# Patient Record
Sex: Male | Born: 1965 | Race: Black or African American | Hispanic: No | Marital: Married | Smoking: Never smoker
Health system: Southern US, Community
[De-identification: ages and names within clinical notes are randomized; demographics above are authoritative.]

## PROBLEM LIST (undated history)

## (undated) HISTORY — PX: LITHOTRIPSY: SUR834

---

## 2004-06-25 ENCOUNTER — Emergency Department: Payer: Self-pay | Admitting: Emergency Medicine

## 2004-08-05 ENCOUNTER — Emergency Department: Payer: Self-pay | Admitting: Emergency Medicine

## 2005-05-08 ENCOUNTER — Emergency Department: Payer: Self-pay | Admitting: Emergency Medicine

## 2005-05-19 ENCOUNTER — Emergency Department: Payer: Self-pay | Admitting: Emergency Medicine

## 2005-05-22 ENCOUNTER — Emergency Department: Payer: Self-pay | Admitting: Internal Medicine

## 2005-08-31 ENCOUNTER — Emergency Department: Payer: Self-pay | Admitting: General Practice

## 2008-12-04 ENCOUNTER — Emergency Department: Payer: Self-pay | Admitting: Emergency Medicine

## 2009-02-15 ENCOUNTER — Emergency Department: Payer: Self-pay | Admitting: Emergency Medicine

## 2009-02-22 ENCOUNTER — Emergency Department: Payer: Self-pay | Admitting: Emergency Medicine

## 2009-03-09 ENCOUNTER — Emergency Department: Payer: Self-pay | Admitting: Emergency Medicine

## 2009-04-14 ENCOUNTER — Emergency Department: Payer: Self-pay | Admitting: Unknown Physician Specialty

## 2009-07-06 ENCOUNTER — Emergency Department: Payer: Self-pay | Admitting: Emergency Medicine

## 2009-10-19 ENCOUNTER — Emergency Department: Payer: Self-pay | Admitting: Emergency Medicine

## 2010-11-08 ENCOUNTER — Emergency Department: Payer: Self-pay | Admitting: Emergency Medicine

## 2010-11-29 ENCOUNTER — Emergency Department: Payer: Self-pay | Admitting: Emergency Medicine

## 2011-02-14 ENCOUNTER — Emergency Department: Payer: Self-pay | Admitting: Emergency Medicine

## 2011-02-21 ENCOUNTER — Emergency Department: Payer: Self-pay | Admitting: Internal Medicine

## 2011-06-07 ENCOUNTER — Emergency Department: Payer: Self-pay | Admitting: *Deleted

## 2011-07-05 ENCOUNTER — Emergency Department: Payer: Self-pay | Admitting: Emergency Medicine

## 2011-07-07 ENCOUNTER — Emergency Department: Payer: Self-pay | Admitting: Emergency Medicine

## 2011-07-09 ENCOUNTER — Emergency Department: Payer: Self-pay | Admitting: Unknown Physician Specialty

## 2011-07-11 ENCOUNTER — Emergency Department: Payer: Self-pay | Admitting: Emergency Medicine

## 2011-08-29 ENCOUNTER — Emergency Department: Payer: Self-pay | Admitting: Unknown Physician Specialty

## 2012-12-25 ENCOUNTER — Emergency Department: Payer: Self-pay | Admitting: Emergency Medicine

## 2012-12-25 LAB — BASIC METABOLIC PANEL
Anion Gap: 3 — ABNORMAL LOW (ref 7–16)
Co2: 29 mmol/L (ref 21–32)
Creatinine: 1.03 mg/dL (ref 0.60–1.30)
Glucose: 91 mg/dL (ref 65–99)
Osmolality: 278 (ref 275–301)
Potassium: 4.6 mmol/L (ref 3.5–5.1)

## 2012-12-25 LAB — CBC
HCT: 41.1 % (ref 40.0–52.0)
MCH: 31.4 pg (ref 26.0–34.0)
MCHC: 33.9 g/dL (ref 32.0–36.0)
Platelet: 152 10*3/uL (ref 150–440)
RDW: 14 % (ref 11.5–14.5)

## 2012-12-25 LAB — TROPONIN I: Troponin-I: <0.02 ng/mL

## 2012-12-27 ENCOUNTER — Observation Stay: Payer: Self-pay | Admitting: Internal Medicine

## 2012-12-27 LAB — CBC
HCT: 41.9 % (ref 40.0–52.0)
MCHC: 34.2 g/dL (ref 32.0–36.0)
MCV: 93 fL (ref 80–100)
RBC: 4.52 10*6/uL (ref 4.40–5.90)
RDW: 14.3 % (ref 11.5–14.5)
WBC: 5.7 10*3/uL (ref 3.8–10.6)

## 2012-12-27 LAB — CK TOTAL AND CKMB (NOT AT ARMC)
CK, Total: 357 U/L — ABNORMAL HIGH (ref 35–232)
CK-MB: 2.4 ng/mL (ref 0.5–3.6)

## 2012-12-27 LAB — BASIC METABOLIC PANEL
Chloride: 105 mmol/L (ref 98–107)
Co2: 25 mmol/L (ref 21–32)
EGFR (Non-African Amer.): >60
Glucose: 86 mg/dL (ref 65–99)
Osmolality: 274 (ref 275–301)
Potassium: 4.1 mmol/L (ref 3.5–5.1)
Sodium: 138 mmol/L (ref 136–145)

## 2012-12-28 LAB — CBC WITH DIFFERENTIAL/PLATELET
Basophil #: 0 10*3/uL (ref 0.0–0.1)
Basophil %: 0.4 %
HCT: 40.1 % (ref 40.0–52.0)
HGB: 13.3 g/dL (ref 13.0–18.0)
Lymphocyte #: 1.6 10*3/uL (ref 1.0–3.6)
MCHC: 33.1 g/dL (ref 32.0–36.0)
MCV: 93 fL (ref 80–100)
Monocyte #: 0.5 x10 3/mm (ref 0.2–1.0)
Monocyte %: 9.6 %
Neutrophil %: 57.8 %
Platelet: 142 10*3/uL — ABNORMAL LOW (ref 150–440)
RBC: 4.29 10*6/uL — ABNORMAL LOW (ref 4.40–5.90)
RDW: 14.5 % (ref 11.5–14.5)

## 2012-12-28 LAB — LIPID PANEL
Ldl Cholesterol, Calc: 141 mg/dL — ABNORMAL HIGH (ref 0–100)
Triglycerides: 98 mg/dL (ref 0–200)
VLDL Cholesterol, Calc: 20 mg/dL (ref 5–40)

## 2012-12-28 LAB — BASIC METABOLIC PANEL
Anion Gap: 5 — ABNORMAL LOW (ref 7–16)
Calcium, Total: 8.3 mg/dL — ABNORMAL LOW (ref 8.5–10.1)
Co2: 29 mmol/L (ref 21–32)
EGFR (African American): >60
EGFR (Non-African Amer.): >60
Glucose: 104 mg/dL — ABNORMAL HIGH (ref 65–99)
Potassium: 4 mmol/L (ref 3.5–5.1)
Sodium: 136 mmol/L (ref 136–145)

## 2013-07-01 ENCOUNTER — Emergency Department: Payer: Self-pay | Admitting: Emergency Medicine

## 2013-07-01 LAB — URINALYSIS, COMPLETE
Blood: NEGATIVE
Glucose,UR: NEGATIVE mg/dL (ref 0–75)
Ketone: NEGATIVE
Leukocyte Esterase: NEGATIVE
Nitrite: NEGATIVE
Ph: 5 (ref 4.5–8.0)
Protein: NEGATIVE
Squamous Epithelial: <1
WBC UR: 1 /HPF (ref 0–5)

## 2013-07-01 LAB — COMPREHENSIVE METABOLIC PANEL
Albumin: 3.8 g/dL (ref 3.4–5.0)
Alkaline Phosphatase: 65 U/L (ref 50–136)
Anion Gap: 9 (ref 7–16)
BUN: 10 mg/dL (ref 7–18)
Bilirubin,Total: 0.2 mg/dL (ref 0.2–1.0)
Calcium, Total: 8.5 mg/dL (ref 8.5–10.1)
Co2: 23 mmol/L (ref 21–32)
EGFR (African American): >60
EGFR (Non-African Amer.): >60
Glucose: 103 mg/dL — ABNORMAL HIGH (ref 65–99)
Sodium: 136 mmol/L (ref 136–145)
Total Protein: 7 g/dL (ref 6.4–8.2)

## 2013-07-01 LAB — ETHANOL
Ethanol %: 0.21 % — ABNORMAL HIGH (ref 0.000–0.080)
Ethanol: 210 mg/dL

## 2013-07-01 LAB — DRUG SCREEN, URINE
Amphetamines, Ur Screen: NEGATIVE (ref ?–1000)
Barbiturates, Ur Screen: NEGATIVE (ref ?–200)
Benzodiazepine, Ur Scrn: NEGATIVE (ref ?–200)
Phencyclidine (PCP) Ur S: NEGATIVE (ref ?–25)

## 2013-07-01 LAB — CBC
HGB: 13.1 g/dL (ref 13.0–18.0)
MCH: 31.6 pg (ref 26.0–34.0)
RBC: 4.13 10*6/uL — ABNORMAL LOW (ref 4.40–5.90)
RDW: 13.7 % (ref 11.5–14.5)
WBC: 5.3 10*3/uL (ref 3.8–10.6)

## 2013-07-19 ENCOUNTER — Emergency Department: Payer: Self-pay | Admitting: Emergency Medicine

## 2013-07-31 ENCOUNTER — Emergency Department: Payer: Self-pay | Admitting: Internal Medicine

## 2013-07-31 LAB — BASIC METABOLIC PANEL
Anion Gap: 1 — ABNORMAL LOW (ref 7–16)
BUN: 16 mg/dL (ref 7–18)
Chloride: 106 mmol/L (ref 98–107)
Creatinine: 1.11 mg/dL (ref 0.60–1.30)
EGFR (African American): >60
Glucose: 100 mg/dL — ABNORMAL HIGH (ref 65–99)
Osmolality: 277 (ref 275–301)
Potassium: 3.8 mmol/L (ref 3.5–5.1)
Sodium: 138 mmol/L (ref 136–145)

## 2013-07-31 LAB — CBC
HCT: 40 % (ref 40.0–52.0)
HGB: 13.5 g/dL (ref 13.0–18.0)
MCH: 31 pg (ref 26.0–34.0)
MCHC: 33.6 g/dL (ref 32.0–36.0)
MCV: 92 fL (ref 80–100)
Platelet: 157 10*3/uL (ref 150–440)
RBC: 4.34 10*6/uL — ABNORMAL LOW (ref 4.40–5.90)
RDW: 13.9 % (ref 11.5–14.5)
WBC: 4.7 10*3/uL (ref 3.8–10.6)

## 2013-07-31 LAB — TROPONIN I: Troponin-I: <0.02 ng/mL

## 2013-12-13 ENCOUNTER — Emergency Department: Payer: Self-pay | Admitting: Emergency Medicine

## 2014-05-20 ENCOUNTER — Emergency Department: Payer: Self-pay | Admitting: Student

## 2015-01-08 NOTE — Consult Note (Signed)
PATIENT NAME:  [name], [name] MR#:  [id number] DATE OF BIRTH:  [birth year]  DATE OF CONSULTATION:  12/27/2012  REFERRING PHYSICIAN:   CONSULTING PHYSICIAN:  Laurier NancyShaukat A. Tonee Silverstein, MD  INDICATION FOR CONSULTATION: Chest pain.  HISTORY OF PRESENT ILLNESS:  This is a 49 year old African American male with a past medical history of no significant medical problems who came into the hospital a few days ago with chest pain which started Tuesday.  Has been having chest pain since then. He was seen in the Emergency Room, was supposed to be followed up by Dr. Mariah MillingGollan, but apparently nobody called him.  He comes back again with chest pain. He ruled out for MI at that time. He is having chest pain again. He continues to have chest pain. The chest pain is described as sharp, associated with shortness of breath.  His first set of cardiac enzymes and the second set on April 9th were negative. The rest of the labs were negative. Today, on the first set of cardiac enzymes, the troponin is negative, but CPK is slightly elevated 375.  PAST MEDICAL HISTORY: No history of diabetes, hypertension or hyperlipidemia.    FAMILY HISTORY: Unremarkable.   SOCIAL HISTORY: Denies EtOH abuse or smoking.   PHYSICAL EXAMINATION: GENERAL: He is alert, oriented x 3, in no acute distress.  VITAL SIGNS: Stable.  NECK: No JVD.  LUNGS: Clear.  HEART: Regular rate and rhythm. Normal S1, S2. No audible murmur.  ABDOMEN: Soft, nontender. Positive bowel sounds.  EXTREMITIES: No pedal edema.  NEUROLOGIC: The patient appears to be intact.   LABORATORY AND DIAGNOSTICS:  EKG shows normal sinus rhythm, no acute changes.   His first set of cardiac enzymes is negative. CPK is 357.   ASSESSMENT AND PLAN: Atypical chest pain most likely secondary to musculoskeletal pain. CPK is elevated. He denies any trauma to the chest, but appears to be atypical chest pain. Advise giving nonsteroidal anti-inflammatory drugs to relieve the pain. Advise getting  3 sets of cardiac enzymes.  Thank you very much for the referral.   ____________________________ Laurier NancyShaukat A. Charnice Zwilling, MD sak:sb D: 12/27/2012 09:16:18 ET T: 12/27/2012 09:37:28 ET JOB#: [id number]  cc: Laurier NancyShaukat A. Yailyn Strack, MD, <Dictator> Laurier NancySHAUKAT A Jeromey Kruer MD ELECTRONICALLY SIGNED 12/30/2012 8:54

## 2015-01-08 NOTE — H&P (Signed)
PATIENT NAME:  [name], [name] MR#:  [id number] DATE OF BIRTH:  [birth year]  DATE OF ADMISSION:  12/27/2012  REFERRING ER PHYSICIAN:  Dr. Maricela BoLuna Ragsdale.   PRIMARY CARDIOLOGIST:  Dr. Adrian BlackwaterShaukat Khan.  PRIMARY CARE PHYSICIAN:  Dr. Toy CookeyErnest Eason.   CHIEF COMPLAINT:  Chest pain and shortness of breath.   HISTORY OF PRESENTING ILLNESS:  This is a 49 year old male with no known past medical history and not following with any doctor since the last 2 to 3 years. He says that he was in his usual state of health and feeling perfectly fine until 4 days ago, that was Monday. On Tuesday, he had an episode of chest pain, which was left side and coming towards the sternum and also going to his left shoulder every time when he tried to do some exertional activity, even minimum exertion like walking 15 to 20 steps, and also felt nauseous and he threw up after that. He has to take rest continuously because of that pain. He came to the Emergency Room the next day, which was Wednesday, on April 9th. His 2 troponins were done. Chest x-ray was done, all of them were negative, and so he was discharged home with reassurance. Yesterday he remained almost in the bed and not doing much activities because he was scared to have the pain again. Today morning he woke up with the pain and then he tried to get up and go to the bathroom, he started having severe pain and he threw up again and so he decided to come to the Emergency Room back again. On further questioning, he rated the pain from 1 to 10 up to 7 and which is intermittent and comes with any kind of minimal exertional activity and gets relieved with rest, radiating to left shoulder and sometimes up to left arm causing numbness. He denies any cough or fever and has mild palpitations with this type of episodes.   REVIEW OF SYSTEMS:  CONSTITUTIONAL:  Denies any fever, fatigue, weight loss.  EYES:  No blurring, double vision, redness or discharge from the eyes.  EARS:  No tingling,  tinnitus or ringing in the ears  RESPIRATORY:  No cough, wheezing, or shortness of breath.  CARDIOVASCULAR:  Has some chest pain and mild palpitations, denies any dizziness.  ABDOMEN:  Has episode of nausea and vomiting. No diarrhea. No abdominal pain.  SKIN:  No rashes.  JOINTS:  No swelling or pain.  NEUROLOGICAL:  No tremor, weakness or headache.  PSYCHIATRIC:  He does not appear in any acute psychiatric illness.   PAST MEDICAL HISTORY:  Unknown as he was never following with any doctors.  SURGERY:  Had some kidney stones surgeries in the past.   HOME MEDICATIONS:  None.   ALLERGIES:  No known drug allergy.   FAMILY HISTORY:  Positive for diabetes and hypertension in mother and having kidney failure due to diabetes in one of the sisters.   SOCIAL HISTORY:  He denies smoking. He drinks alcohol every weekend, but never got heavy drinking or drunk. He denies any illegal drug use and working as a [employer].    PHYSICAL EXAMINATION:  VITAL SIGNS:  Temperature 98.3, pulse rate 70, respirations 16, blood pressure 127/78 and pulse ox 96 on room air.  GENERAL:  He is fully alert, oriented to time, place and person and does not appear in any acute distress.  HEENT:  Head and neck atraumatic. Conjunctivae pink. Oral mucosa moist.  NECK:  Supple. No JVD.  RESPIRATORY:  Bilateral clear and equal air entry.  CARDIOVASCULAR: S1, S2 present, regular, no murmur. ABDOMEN:  Soft, nontender. Bowel sounds present. No organomegaly.  SKIN:  No rashes.  JOINTS:  No swelling or edema or tenderness.  LEGS:  No edema.  NEUROLOGICAL:  Power 5/5 in all 4 limbs. Follows commands. No tremor  PSYCHIATRIC:  He does not appear in any acute psychiatric illness at this time.   ASSESSMENT AND PLAN:  This is a 49 year old male with no known past medical history, came to the hospital with recurrent chest pains, which are occurring with minimal exertion, and also associated with mild short of breath with those  episodes. 1.  Recurrent chest pain, looks like atypical, mostly muscular origin. We will have to follow his troponin 3 times and we will do stress test as this is recurrent complaint to rule out any cardiac issues. Dr. Adrian Blackwater from Cardiology saw the patient, and I spoke to him on the phone and he suggested to get stress test for the patient tomorrow.   TIME SPENT ON THIS ADMISSION:  45 minutes.  ____________________________ Hope Pigeon Elisabeth Pigeon, MD vgv:jm D: 12/27/2012 15:33:47 ET T: 12/27/2012 16:49:54 ET JOB#: [id number]  cc: Hope Pigeon. Elisabeth Pigeon, MD, <Dictator> Altamese Dilling MD ELECTRONICALLY SIGNED 12/29/2012 22:23

## 2015-01-08 NOTE — Discharge Summary (Signed)
PATIENT NAME:  [name], [name] DATE OF BIRTH:  Jul 07, 1966  DATE OF ADMISSION:  12/27/2012 DATE OF DISCHARGE:  12/28/2012  PRIMARY CARE PHYSICIAN: Toy CookeyErnest Eason, MD  DISCHARGE DIAGNOSES: 1. Costochondritis with musculoskeletal chest pain.  2. Tobacco abuse.   IMAGING STUDIES DONE: Include a nuclear stress test which showed no reversible ischemia.   CONSULTATIONS: Dr. Welton FlakesKhan of cardiology.   ADMITTING HISTORY AND PHYSICAL AND HOSPITAL COURSE: Please see detailed H and P dictated on 12/27/2012. In brief, this 49 year old male patient was admitted to the hospitalist service to rule out acute coronary syndrome after presenting with chest pain. Dr. Welton FlakesKhan of cardiology was consulted. Three sets of cardiac enzymes were normal. The patient had a normal stress test and with pleuritic chest pain was diagnosed with costochondritis, started on ibuprofen scheduled for 5 days and discharged home.   On the day of discharge, the patient had some mild tenderness on palpation of the chest. His blood pressure was 110/64, pulse of 60, and he was discharged home to follow up with primary care physician, Dr. [name].   DISCHARGE MEDICATIONS: 1. Aspirin 81 mg oral once a day.  2. Ibuprofen 600 mg oral 3 times a day for 5 days.   DISCHARGE INSTRUCTIONS:  1. Low-fat, low-cholesterol diet.  2. Activity as tolerated.  3. Follow up with primary care physician in 1 to 2 weeks.     TIME SPENT:   On day of discharge in discharge activity was 40 minutes.    ____________________________ Molinda BailiffSrikar R. Terius Jacuinde, MD srs:cb D: 12/30/2012 13:44:51 ET T: 12/30/2012 14:01:04 ET JOB#: [id number]  cc: Wardell HeathSrikar R. Janelys Glassner, MD, <Dictator> Serita ShellerErnest B. [name], MD Orie FishermanSRIKAR R Eyob Godlewski MD ELECTRONICALLY SIGNED 12/30/2012 19:15

## 2015-02-26 ENCOUNTER — Encounter: Payer: Self-pay | Admitting: Emergency Medicine

## 2015-02-26 ENCOUNTER — Emergency Department
Admission: EM | Admit: 2015-02-26 | Discharge: 2015-02-26 | Disposition: A | Discharge status: Home / Self Care | Payer: Self-pay | Attending: Emergency Medicine | Admitting: Emergency Medicine

## 2015-02-26 ENCOUNTER — Other Ambulatory Visit: Payer: Self-pay

## 2015-02-26 ENCOUNTER — Emergency Department: Payer: Self-pay

## 2015-02-26 DIAGNOSIS — R0602 Shortness of breath: Secondary | ICD-10-CM | POA: Insufficient documentation

## 2015-02-26 DIAGNOSIS — R079 Chest pain, unspecified: Secondary | ICD-10-CM | POA: Insufficient documentation

## 2015-02-26 DIAGNOSIS — R200 Anesthesia of skin: Secondary | ICD-10-CM | POA: Insufficient documentation

## 2015-02-26 LAB — COMPREHENSIVE METABOLIC PANEL
ALT: 23 U/L (ref 17–63)
AST: 26 U/L (ref 15–41)
Albumin: 4.1 g/dL (ref 3.5–5.0)
Alkaline Phosphatase: 51 U/L (ref 38–126)
Anion gap: 7 (ref 5–15)
BUN: 10 mg/dL (ref 6–20)
CO2: 27 mmol/L (ref 22–32)
Calcium: 9 mg/dL (ref 8.9–10.3)
Chloride: 104 mmol/L (ref 101–111)
Creatinine, Ser: 1.01 mg/dL (ref 0.61–1.24)
GFR calc Af Amer: >60 mL/min (ref >60)
GLUCOSE: 99 mg/dL (ref 65–99)
Potassium: 4.4 mmol/L (ref 3.5–5.1)
Sodium: 138 mmol/L (ref 135–145)
Total Bilirubin: 0.4 mg/dL (ref 0.3–1.2)
Total Protein: 7.2 g/dL (ref 6.5–8.1)

## 2015-02-26 LAB — CBC
HEMATOCRIT: 39.6 % — AB (ref 40.0–52.0)
HEMOGLOBIN: 13.3 g/dL (ref 13.0–18.0)
MCH: 32.5 pg (ref 26.0–34.0)
MCHC: 33.5 g/dL (ref 32.0–36.0)
MCV: 97 fL (ref 80.0–100.0)
Platelets: 143 10*3/uL — ABNORMAL LOW (ref 150–440)
RBC: 4.08 MIL/uL — AB (ref 4.40–5.90)
RDW: 13.8 % (ref 11.5–14.5)
WBC: 4.4 10*3/uL (ref 3.8–10.6)

## 2015-02-26 LAB — TROPONIN I: Troponin I: <0.03 ng/mL (ref <0.031)

## 2015-02-26 NOTE — Discharge Instructions (Signed)

## 2015-02-26 NOTE — ED Notes (Signed)
Pt to ed with c/o chest pain that started last night while watching tv, pt states pain was center and right side of chest and that he felt sob with the pain.

## 2015-02-26 NOTE — ED Provider Notes (Addendum)
[facility] Emergency Department Provider Note     Time seen: ----------------------------------------- 8:51 AM on 02/26/2015 -----------------------------------------    I have reviewed the triage vital signs and the nursing notes.   HISTORY  Chief Complaint Chest Pain    HPI [name] is a 49 y.o. male who awoke yesterday with some chest pressure at rest. Nothing seems to better or worse. Also shortness of breath associated with this pain, as never had anything like this before. Denies any fevers chills or recent illness. Denies sweats or nausea with the pain. He had some numbness in the right arm associated with the symptoms, this resolve. Symptoms recurred and worsened.   History reviewed. No pertinent past medical history.  There are no active problems to display for this patient.   Past Surgical History  Procedure Laterality Date  . Lithotripsy      Allergies Review of patient's allergies indicates no known allergies.  Social History History  Substance Use Topics  . Smoking status: Never Smoker   . Smokeless tobacco: Not on file  . Alcohol Use: Yes    Review of Systems Constitutional: Negative for fever. Eyes: Negative for visual changes. ENT: Negative for sore throat. Cardiovascular: Positive for chest pain Respiratory: Positive for shortness of breath Gastrointestinal: Negative for abdominal pain, vomiting and diarrhea. Genitourinary: Negative for dysuria. Musculoskeletal: Negative for back pain. Skin: Negative for rash. Neurological: Negative for headaches, positive for right arm numbness  10-point ROS otherwise negative.  ____________________________________________   PHYSICAL EXAM:  VITAL SIGNS: ED Triage Vitals  Enc Vitals Group     BP 02/26/15 0845 142/96 mmHg     Pulse Rate 02/26/15 0845 69     Resp 02/26/15 0845 18     Temp 02/26/15 0845 98.2 F (36.8 C)     Temp Source 02/26/15 0845 Oral     SpO2  02/26/15 0845 97 %     Weight 02/26/15 0845 190 lb (86.183 kg)     Height 02/26/15 0845  (1.778 m)     Head Cir --      Peak Flow --      Pain Score 02/26/15 0846 7     Pain Loc --      Pain Edu? --      Excl. in GC? --     Constitutional: Alert and oriented. Well appearing and in no distress. Eyes: Conjunctivae are normal. PERRL. Normal extraocular movements. ENT   Head: Normocephalic and atraumatic.   Nose: No congestion/rhinnorhea.   Mouth/Throat: Mucous membranes are moist.   Neck: No stridor. Hematological/Lymphatic/Immunilogical: No cervical lymphadenopathy. Cardiovascular: Normal rate, regular rhythm. Normal and symmetric distal pulses are present in all extremities. No murmurs, rubs, or gallops. Respiratory: Normal respiratory effort without tachypnea nor retractions. Breath sounds are clear and equal bilaterally. No wheezes/rales/rhonchi. Gastrointestinal: Soft and nontender. No distention. No abdominal bruits. There is no CVA tenderness. Musculoskeletal: Nontender with normal range of motion in all extremities. No joint effusions.  No lower extremity tenderness nor edema. Neurologic:  Normal speech and language. No gross focal neurologic deficits are appreciated. Speech is normal. No gait instability. Skin:  Skin is warm, dry and intact. No rash noted. Psychiatric: Mood and affect are normal. Speech and behavior are normal. Patient exhibits appropriate insight and judgment. ____________________________________________  EKG: Interpreted by me. Normal sinus rhythm, with normal axis normal intervals, no evidence of hypertrophy or acute infarction. Rate is 65  ____________________________________________  ED COURSE:  Pertinent labs & imaging  results that were available during my care of the patient were reviewed by me and considered in my medical decision making (see chart for details). We'll check cardiac enzymes, chest x-ray and  reevaluate. ____________________________________________    LABS (pertinent positives/negatives)  Labs Reviewed  CBC - Abnormal; Notable for the following:    RBC 4.08 (*)    HCT 39.6 (*)    Platelets 143 (*)    All other components within normal limits  TROPONIN I  COMPREHENSIVE METABOLIC PANEL    RADIOLOGY  Chest x-ray FINDINGS: Normal heart size and mediastinal contours. No acute infiltrate or edema. No effusion or pneumothorax. No acute osseous findings.  IMPRESSION: Negative chest. ____________________________________________  FINAL ASSESSMENT AND PLAN  Chest pain  Plan: Patient with negative workup here, recent stress test in 2014 was unremarkable. He does not have any known risk factors for coronary artery disease, does not have hypertension diabetes high blood pressure or high cholesterol that we are aware of. Patient does not have any strong family history. Pain is likely either stress related or musculoskeletal. Stable for outpatient follow-up   Emily Filbert, MD   Emily Filbert, MD 02/26/15 1024  Emily Filbert, MD 02/26/15 1028

## 2015-05-28 ENCOUNTER — Emergency Department
Admission: EM | Admit: 2015-05-28 | Discharge: 2015-05-28 | Disposition: A | Discharge status: Home / Self Care | Payer: Self-pay | Attending: Emergency Medicine | Admitting: Emergency Medicine

## 2015-05-28 ENCOUNTER — Encounter: Payer: Self-pay | Admitting: Emergency Medicine

## 2015-05-28 ENCOUNTER — Emergency Department: Payer: Self-pay

## 2015-05-28 DIAGNOSIS — K2970 Gastritis, unspecified, without bleeding: Secondary | ICD-10-CM

## 2015-05-28 DIAGNOSIS — R079 Chest pain, unspecified: Secondary | ICD-10-CM

## 2015-05-28 MED ORDER — RANITIDINE HCL 150 MG PO CAPS: 150.0000 mg | ORAL_CAPSULE | Freq: Two times a day (BID) | ORAL | Status: DC

## 2015-05-28 MED ORDER — GI COCKTAIL ~~LOC~~
30.0000 mL | ORAL | Status: AC
  Administered 2015-05-28: 30 mL via ORAL
  Filled 2015-05-28: qty 30

## 2015-05-28 MED ORDER — SUCRALFATE 1 G PO TABS: 1.0000 g | ORAL_TABLET | Freq: Four times a day (QID) | ORAL | Status: DC

## 2015-05-28 MED ORDER — FAMOTIDINE 20 MG PO TABS
40.0000 mg | ORAL_TABLET | Freq: Once | ORAL | Status: AC
  Administered 2015-05-28: 40 mg via ORAL
  Filled 2015-05-28: qty 2

## 2015-05-28 NOTE — ED Notes (Signed)
Patient transported to X-ray 

## 2015-05-28 NOTE — ED Notes (Signed)
C/o cp onset this am.  Skin w/d.

## 2015-05-28 NOTE — ED Provider Notes (Signed)
[facility] Emergency Department Provider Note  ____________________________________________  Time seen: 9:40 AM  I have reviewed the triage vital signs and the nursing notes.   HISTORY  Chief Complaint Chest Pain    HPI [name] is a 49 y.o. male who complains of lower midsternal chest pain that started last night around 8 PM. He had eaten salami for dinner around 6:30 or 7:00. The pain continued all night as he landed bedtime to sleep, so this morning he comes to the hospital for evaluation. He ate breakfast normally this morning. No fevers or chills cough or shortness of breath. No vomiting or diaphoresis. No syncope. The pain is not exertional nor pleuritic.The pain is nonradiating and achy.     History reviewed. No pertinent past medical history.   There are no active problems to display for this patient.    Past Surgical History  Procedure Laterality Date  . Lithotripsy       Current Outpatient Rx  Name  Route  Sig  Dispense  Refill  . ranitidine (ZANTAC) 150 MG capsule   Oral   Take 1 capsule (150 mg total) by mouth 2 (two) times daily.   28 capsule   0   . sucralfate (CARAFATE) 1 G tablet   Oral   Take 1 tablet (1 g total) by mouth 4 (four) times daily.   120 tablet   1      Allergies Review of patient's allergies indicates no known allergies.   History reviewed. No pertinent family history.  Social History Social History  Substance Use Topics  . Smoking status: Never Smoker   . Smokeless tobacco: None  . Alcohol Use: Yes    Review of Systems  Constitutional:   No fever or chills. No weight changes Eyes:   No blurry vision or double vision.  ENT:   No sore throat. Cardiovascular:  Positive chest pain as above  Respiratory:   No dyspnea or cough. Gastrointestinal:   Negative for abdominal pain, vomiting and diarrhea.  No BRBPR or melena. Genitourinary:   Negative for dysuria, urinary retention, bloody urine,  or difficulty urinating. Musculoskeletal:   Negative for back pain. No joint swelling or pain. Skin:   Negative for rash. Neurological:   Negative for headaches, focal weakness or numbness. Psychiatric:  No anxiety or depression.   Endocrine:  No hot/cold intolerance, changes in energy, or sleep difficulty.  10-point ROS otherwise negative.  ____________________________________________   PHYSICAL EXAM:  VITAL SIGNS: ED Triage Vitals  Enc Vitals Group     BP 05/28/15 0918 147/95 mmHg     Pulse Rate 05/28/15 0918 68     Resp 05/28/15 0918 18     Temp 05/28/15 0918 99.5 F (37.5 C)     Temp Source 05/28/15 0918 Oral     SpO2 05/28/15 0918 97 %     Weight 05/28/15 0929 190 lb (86.183 kg)     Height 05/28/15 0929 5\' 10"  (1.778 m)     Head Cir --      Peak Flow --      Pain Score --      Pain Loc --      Pain Edu? --      Excl. in GC? --      Constitutional:   Alert and oriented. Well appearing and in no distress. Eyes:   No scleral icterus. No conjunctival pallor. PERRL. EOMI ENT   Head:   Normocephalic and atraumatic.  Nose:   No congestion/rhinnorhea. No septal hematoma   Mouth/Throat:   MMM, no pharyngeal erythema. No peritonsillar mass. No uvula shift.   Neck:   No stridor. No SubQ emphysema. No meningismus. Hematological/Lymphatic/Immunilogical:   No cervical lymphadenopathy. Cardiovascular:   RRR. Normal and symmetric distal pulses are present in all extremities. No murmurs, rubs, or gallops. Respiratory:   Normal respiratory effort without tachypnea nor retractions. Breath sounds are clear and equal bilaterally. No wheezes/rales/rhonchi. Gastrointestinal:   Soft with mild tenderness of the left upper quadrant and epigastrium. No distention. There is no CVA tenderness.  No rebound, rigidity, or guarding. Genitourinary:   deferred Musculoskeletal:   Nontender with normal range of motion in all extremities. No joint effusions.  No lower extremity  tenderness.  No edema. Neurologic:   Normal speech and language.  CN 2-10 normal. Motor grossly intact. No pronator drift.  Normal gait. No gross focal neurologic deficits are appreciated.  Skin:    Skin is warm, dry and intact. No rash noted.  No petechiae, purpura, or bullae. Psychiatric:   Mood and affect are normal. Speech and behavior are normal. Patient exhibits appropriate insight and judgment.  ____________________________________________    LABS (pertinent positives/negatives) (all labs ordered are listed, but only abnormal results are displayed) Labs Reviewed - No data to display ____________________________________________   EKG  Interpreted by me  Date: 05/28/2015  Rate: 75  Rhythm: normal sinus rhythm  QRS Axis: normal  Intervals: normal  ST/T Wave abnormalities: normal  Conduction Disutrbances: none  Narrative Interpretation: unremarkable      ____________________________________________    RADIOLOGY  Chest x-ray unremarkable  ____________________________________________   PROCEDURES   ____________________________________________   INITIAL IMPRESSION / ASSESSMENT AND PLAN / ED COURSE  Pertinent labs & imaging results that were available during my care of the patient were reviewed by me and considered in my medical decision making (see chart for details).  Patient presents with chest pain that is atypical in nature most likely due to gastritis given physical exam and onset after eating a salami. He is very well-appearing no acute distress. Low suspicion for ACS PE TAD pneumothorax carditis mediastinitis pneumonia or sepsis. Very low suspicion for obstruction perforation or AAA in the abdomen. We'll try a course of antacid therapy and have him follow up with primary care.     ____________________________________________   FINAL CLINICAL IMPRESSION(S) / ED DIAGNOSES  Final diagnoses:  Chest pain, unspecified chest pain type  Gastritis       Sharman Cheek, MD 05/28/15 1021

## 2015-05-28 NOTE — Discharge Instructions (Signed)
Chest Pain (Nonspecific) °It is often hard to give a specific diagnosis for the cause of chest pain. There is always a chance that your pain could be related to something serious, such as a heart attack or a blood clot in the lungs. You need to follow up with your health care provider for further evaluation. °CAUSES  °· Heartburn. °· Pneumonia or bronchitis. °· Anxiety or stress. °· Inflammation around your heart (pericarditis) or lung (pleuritis or pleurisy). °· A blood clot in the lung. °· A collapsed lung (pneumothorax). It can develop suddenly on its own (spontaneous pneumothorax) or from trauma to the chest. °· Shingles infection (herpes zoster virus). °The chest wall is composed of bones, muscles, and cartilage. Any of these can be the source of the pain. °· The bones can be bruised by injury. °· The muscles or cartilage can be strained by coughing or overwork. °· The cartilage can be affected by inflammation and become sore (costochondritis). °DIAGNOSIS  °Lab tests or other studies may be needed to find the cause of your pain. Your health care provider may have you take a test called an ambulatory electrocardiogram (ECG). An ECG records your heartbeat patterns over a 24-hour period. You may also have other tests, such as: °· Transthoracic echocardiogram (TTE). During echocardiography, sound waves are used to evaluate how blood flows through your heart. °· Transesophageal echocardiogram (TEE). °· Cardiac monitoring. This allows your health care provider to monitor your heart rate and rhythm in real time. °· Holter monitor. This is a portable device that records your heartbeat and can help diagnose heart arrhythmias. It allows your health care provider to track your heart activity for several days, if needed. °· Stress tests by exercise or by giving medicine that makes the heart beat faster. °TREATMENT  °· Treatment depends on what may be causing your chest pain. Treatment may include: °· Acid blockers for  heartburn. °· Anti-inflammatory medicine. °· Pain medicine for inflammatory conditions. °· Antibiotics if an infection is present. °· You may be advised to change lifestyle habits. This includes stopping smoking and avoiding alcohol, caffeine, and chocolate. °· You may be advised to keep your head raised (elevated) when sleeping. This reduces the chance of acid going backward from your stomach into your esophagus. °Most of the time, nonspecific chest pain will improve within 2-3 days with rest and mild pain medicine.  °HOME CARE INSTRUCTIONS  °· If antibiotics were prescribed, take them as directed. Finish them even if you start to feel better. °· For the next few days, avoid physical activities that bring on chest pain. Continue physical activities as directed. °· Do not use any tobacco products, including cigarettes, chewing tobacco, or electronic cigarettes. °· Avoid drinking alcohol. °· Only take medicine as directed by your health care provider. °· Follow your health care provider's suggestions for further testing if your chest pain does not go away. °· Keep any follow-up appointments you made. If you do not go to an appointment, you could develop lasting (chronic) problems with pain. If there is any problem keeping an appointment, call to reschedule. °SEEK MEDICAL CARE IF:  °· Your chest pain does not go away, even after treatment. °· You have a rash with blisters on your chest. °· You have a fever. °SEEK IMMEDIATE MEDICAL CARE IF:  °· You have increased chest pain or pain that spreads to your arm, neck, jaw, back, or abdomen. °· You have shortness of breath. °· You have an increasing cough, or you cough   up blood. °· You have severe back or abdominal pain. °· You feel nauseous or vomit. °· You have severe weakness. °· You faint. °· You have chills. °This is an emergency. Do not wait to see if the pain will go away. Get medical help at once. Call your local emergency services (911 in [place] Do not drive  yourself to the hospital. °MAKE SURE YOU:  °· Understand these instructions. °· Will watch your condition. °· Will get help right away if you are not doing well or get worse. °Document Released: 06/14/2005 Document Revised: 09/09/2013 Document Reviewed: 04/09/2008 °Exit[employer]® Patient Information ©2015 [employer]. This information is not intended to replace advice given to you by your health care provider. Make sure you discuss any questions you have with your health care provider. °Gastritis, Adult °Gastritis is soreness and swelling (inflammation) of the lining of the stomach. Gastritis can develop as a sudden onset (acute) or long-term (chronic) condition. If gastritis is not treated, it can lead to stomach bleeding and ulcers. °CAUSES  °Gastritis occurs when the stomach lining is weak or damaged. Digestive juices from the stomach then inflame the weakened stomach lining. The stomach lining may be weak or damaged due to viral or bacterial infections. One common bacterial infection is the Helicobacter pylori infection. Gastritis can also result from excessive alcohol consumption, taking certain medicines, or having too much acid in the stomach.  °SYMPTOMS  °In some cases, there are no symptoms. When symptoms are present, they may include: °· Pain or a burning sensation in the upper abdomen. °· Nausea. °· Vomiting. °· An uncomfortable feeling of fullness after eating. °DIAGNOSIS  °Your caregiver may suspect you have gastritis based on your symptoms and a physical exam. To determine the cause of your gastritis, your caregiver may perform the following: °· Blood or stool tests to check for the H pylori bacterium. °· Gastroscopy. A thin, flexible tube (endoscope) is passed down the esophagus and into the stomach. The endoscope has a light and camera on the end. Your caregiver uses the endoscope to view the inside of the stomach. °· Taking a tissue sample (biopsy) from the stomach to examine under a  microscope. °TREATMENT  °Depending on the cause of your gastritis, medicines may be prescribed. If you have a bacterial infection, such as an H pylori infection, antibiotics may be given. If your gastritis is caused by too much acid in the stomach, H2 blockers or antacids may be given. Your caregiver may recommend that you stop taking aspirin, ibuprofen, or other nonsteroidal anti-inflammatory drugs (NSAIDs). °HOME CARE INSTRUCTIONS °· Only take over-the-counter or prescription medicines as directed by your caregiver. °· If you were given antibiotic medicines, take them as directed. Finish them even if you start to feel better. °· Drink enough fluids to keep your urine clear or pale yellow. °· Avoid foods and drinks that make your symptoms worse, such as: °¨ Caffeine or alcoholic drinks. °¨ Chocolate. °¨ Peppermint or mint flavorings. °¨ Garlic and onions. °¨ Spicy foods. °¨ Citrus fruits, such as oranges, lemons, or limes. °¨ Tomato-based foods such as sauce, chili, salsa, and pizza. °¨ Fried and fatty foods. °· Eat small, frequent meals instead of large meals. °SEEK IMMEDIATE MEDICAL CARE IF:  °· You have black or dark red stools. °· You vomit blood or material that looks like coffee grounds. °· You are unable to keep fluids down. °· Your abdominal pain gets worse. °· You have a fever. °· You do not feel better after   1 week. °· You have any other questions or concerns. °MAKE SURE YOU: °· Understand these instructions. °· Will watch your condition. °· Will get help right away if you are not doing well or get worse. °Document Released: 08/29/2001 Document Revised: 03/05/2012 Document Reviewed: 10/18/2011 °ExitCare® Patient Information ©2015 [employer]. This information is not intended to replace advice given to you by your health care provider. Make sure you discuss any questions you have with your health care provider. ° °

## 2015-05-28 NOTE — ED Notes (Signed)
Patient returned from X-ray 

## 2015-12-15 ENCOUNTER — Emergency Department
Admission: EM | Admit: 2015-12-15 | Discharge: 2015-12-15 | Disposition: A | Discharge status: Home / Self Care | Payer: Self-pay | Attending: Emergency Medicine | Admitting: Emergency Medicine

## 2015-12-15 ENCOUNTER — Encounter: Payer: Self-pay | Admitting: Emergency Medicine

## 2015-12-15 DIAGNOSIS — J029 Acute pharyngitis, unspecified: Secondary | ICD-10-CM | POA: Insufficient documentation

## 2015-12-15 DIAGNOSIS — Z79899 Other long term (current) drug therapy: Secondary | ICD-10-CM | POA: Insufficient documentation

## 2015-12-15 LAB — POCT RAPID STREP A: STREPTOCOCCUS, GROUP A SCREEN (DIRECT): NEGATIVE

## 2015-12-15 MED ORDER — DEXAMETHASONE 10 MG/ML FOR PEDIATRIC ORAL USE
10.0000 mg | Freq: Once | INTRAMUSCULAR | Status: AC
  Administered 2015-12-15: 10 mg via ORAL
  Filled 2015-12-15: qty 1

## 2015-12-15 MED ORDER — DEXAMETHASONE SODIUM PHOSPHATE 10 MG/ML IJ SOLN
INTRAMUSCULAR | Status: AC
  Administered 2015-12-15: 10 mg via ORAL
  Filled 2015-12-15: qty 1

## 2015-12-15 MED ORDER — IBUPROFEN 800 MG PO TABS
800.0000 mg | ORAL_TABLET | Freq: Once | ORAL | Status: AC
  Administered 2015-12-15: 800 mg via ORAL
  Filled 2015-12-15: qty 1

## 2015-12-15 NOTE — ED Notes (Signed)
Patient ambulatory to triage with steady gait, without difficulty or distress noted; pt reports awakening with sore throat

## 2015-12-15 NOTE — ED Provider Notes (Signed)
[facility] Emergency Department Provider Note  ____________________________________________  Time seen: Approximately 640 AM  I have reviewed the triage vital signs and the nursing notes.   HISTORY  Chief Complaint Sore Throat    HPI [name] is a 50 y.o. male who comes into the hospital today with some throat pain. The patient reports a history of a scratchy yesterday and that he woke this morning with his throat feeling swollen on the right side. The patient reports that he felt as though he couldn't breathe. His throat is also sore. He used some throat spray but he reports it barely eased it. He can still feel pain in his throat. The patient rates his pain a 7 out of 10 in intensity. He's had an on and off cough with no runny nose. He reports that he felt warm but he did not take his temperature. The patient was concerned about the symptoms he decided to come in to the hospital and get checked out today.   History reviewed. No pertinent past medical history.  There are no active problems to display for this patient.   Past Surgical History  Procedure Laterality Date  . Lithotripsy      Current Outpatient Rx  Name  Route  Sig  Dispense  Refill  . ranitidine (ZANTAC) 150 MG capsule   Oral   Take 1 capsule (150 mg total) by mouth 2 (two) times daily.   28 capsule   0   . sucralfate (CARAFATE) 1 G tablet   Oral   Take 1 tablet (1 g total) by mouth 4 (four) times daily.   120 tablet   1     Allergies Review of patient's allergies indicates no known allergies.  No family history on file.  Social History Social History  Substance Use Topics  . Smoking status: Never Smoker   . Smokeless tobacco: None  . Alcohol Use: Yes    Review of Systems Constitutional: No fever/chills Eyes: No visual changes. ENT:  sore throat. Cardiovascular: Denies chest pain. Respiratory:Cough and shortness of breath. Gastrointestinal: No abdominal pain.   No nausea, no vomiting.  No diarrhea.  No constipation. Genitourinary: Negative for dysuria. Musculoskeletal: Negative for back pain. Skin: Negative for rash. Neurological: Negative for headaches, focal weakness or numbness.  10-point ROS otherwise negative.  ____________________________________________   PHYSICAL EXAM:  VITAL SIGNS: ED Triage Vitals  Enc Vitals Group     BP 12/15/15 0551 158/80 mmHg     Pulse Rate 12/15/15 0551 82     Resp 12/15/15 0551 18     Temp 12/15/15 0551 98.8 F (37.1 C)     Temp Source 12/15/15 0551 Oral     SpO2 12/15/15 0551 98 %     Weight 12/15/15 0551 190 lb (86.183 kg)     Height 12/15/15 0551  (1.778 m)     Head Cir --      Peak Flow --      Pain Score 12/15/15 0550 7     Pain Loc --      Pain Edu? --      Excl. in GC? --     Constitutional: Alert and oriented. Well appearing and in no acute distress. Eyes: Conjunctivae are normal. PERRL. EOMI. Head: Atraumatic. Nose: No congestion/rhinnorhea. Mouth/Throat: Mucous membranes are moist.  Oropharynx non-erythematous. Cardiovascular: Normal rate, regular rhythm. Grossly normal heart sounds.  Good peripheral circulation. Respiratory: Normal respiratory effort.  No retractions. Lungs CTAB. Gastrointestinal: Soft  and nontender. No distention. Positive bowel sounds Musculoskeletal: No lower extremity tenderness nor edema.   Neurologic:  Normal speech and language.  Skin:  Skin is warm, dry and intact.  Psychiatric: Mood and affect are normal.   ____________________________________________   LABS (all labs ordered are listed, but only abnormal results are displayed)  Labs Reviewed  CULTURE, GROUP A STREP [facility]  POCT RAPID STREP A   ____________________________________________  EKG  None ____________________________________________  RADIOLOGY  None ____________________________________________   PROCEDURES  Procedure(s) performed: None  Critical Care performed:  No  ____________________________________________   INITIAL IMPRESSION / ASSESSMENT AND PLAN / ED COURSE  Pertinent labs & imaging results that were available during my care of the patient were reviewed by me and considered in my medical decision making (see chart for details).  This is a 50 year old male who comes into the hospital today with sore throat. The patient strep test is negative. I did give him a dose of ibuprofen 800 mg as well as dexamethasone. The patient does not have any erythema to his throat nor does he have any lymphadenopathy. Patient will be discharged home to follow-up with his primary care physician. He has no further concerns and his vital signs are unremarkable. ____________________________________________   FINAL CLINICAL IMPRESSION(S) / ED DIAGNOSES  Final diagnoses:  Pharyngitis      Rebecka ApleyAllison P Webster, MD 12/15/15 [phone]

## 2015-12-15 NOTE — Discharge Instructions (Signed)
Pharyngitis Pharyngitis is redness, pain, and swelling (inflammation) of your pharynx.  CAUSES  Pharyngitis is usually caused by infection. Most of the time, these infections are from viruses (viral) and are part of a cold. However, sometimes pharyngitis is caused by bacteria (bacterial). Pharyngitis can also be caused by allergies. Viral pharyngitis may be spread from person to person by coughing, sneezing, and personal items or utensils (cups, forks, spoons, toothbrushes). Bacterial pharyngitis may be spread from person to person by more intimate contact, such as kissing.  SIGNS AND SYMPTOMS  Symptoms of pharyngitis include:   Sore throat.   Tiredness (fatigue).   Low-grade fever.   Headache.  Joint pain and muscle aches.  Skin rashes.  Swollen lymph nodes.  Plaque-like film on throat or tonsils (often seen with bacterial pharyngitis). DIAGNOSIS  Your health care provider will ask you questions about your illness and your symptoms. Your medical history, along with a physical exam, is often all that is needed to diagnose pharyngitis. Sometimes, a rapid strep test is done. Other lab tests may also be done, depending on the suspected cause.  TREATMENT  Viral pharyngitis will usually get better in 3-4 days without the use of medicine. Bacterial pharyngitis is treated with medicines that kill germs (antibiotics).  HOME CARE INSTRUCTIONS   Drink enough water and fluids to keep your urine clear or pale yellow.   Only take over-the-counter or prescription medicines as directed by your health care provider:   If you are prescribed antibiotics, make sure you finish them even if you start to feel better.   Do not take aspirin.   Get lots of rest.   Gargle with 8 oz of salt water ( tsp of salt per 1 qt of water) as often as every 1-2 hours to soothe your throat.   Throat lozenges (if you are not at risk for choking) or sprays may be used to soothe your throat. SEEK MEDICAL  CARE IF:   You have large, tender lumps in your neck.  You have a rash.  You cough up green, yellow-brown, or bloody spit. SEEK IMMEDIATE MEDICAL CARE IF:   Your neck becomes stiff.  You drool or are unable to swallow liquids.  You vomit or are unable to keep medicines or liquids down.  You have severe pain that does not go away with the use of recommended medicines.  You have trouble breathing (not caused by a stuffy nose). MAKE SURE YOU:   Understand these instructions.  Will watch your condition.  Will get help right away if you are not doing well or get worse.   This information is not intended to replace advice given to you by your health care provider. Make sure you discuss any questions you have with your health care provider.   Document Released: 09/04/2005 Document Revised: 06/25/2013 Document Reviewed: 05/12/2013 Elsevier Interactive Patient Education 2016 [employer]  Sore Throat A sore throat is pain, burning, irritation, or scratchiness of the throat. There is often pain or tenderness when swallowing or talking. A sore throat may be accompanied by other symptoms, such as coughing, sneezing, fever, and swollen neck glands. A sore throat is often the first sign of another sickness, such as a cold, flu, strep throat, or mononucleosis (commonly known as mono). Most sore throats go away without medical treatment. CAUSES  The most common causes of a sore throat include:  A viral infection, such as a cold, flu, or mono.  A bacterial infection, such as strep throat,  tonsillitis, or whooping cough. °· Seasonal allergies. °· Dryness in the air. °· Irritants, such as smoke or pollution. °· Gastroesophageal reflux disease (GERD). °HOME CARE INSTRUCTIONS  °· Only take over-the-counter medicines as directed by your caregiver. °· Drink enough fluids to keep your urine clear or pale yellow. °· Rest as needed. °· Try using throat sprays, lozenges, or sucking on hard candy to ease  any pain (if older than 4 years or as directed). °· Sip warm liquids, such as broth, herbal tea, or warm water with honey to relieve pain temporarily. You may also eat or drink cold or frozen liquids such as frozen ice pops. °· Gargle with salt water (mix 1 tsp salt with 8 oz of water). °· Do not smoke and avoid secondhand smoke. °· Put a cool-mist humidifier in your bedroom at night to moisten the air. You can also turn on a hot shower and sit in the bathroom with the door closed for 5-10 minutes. °SEEK IMMEDIATE MEDICAL CARE IF: °· You have difficulty breathing. °· You are unable to swallow fluids, soft foods, or your saliva. °· You have increased swelling in the throat. °· Your sore throat does not get better in 7 days. °· You have nausea and vomiting. °· You have a fever or persistent symptoms for more than 2-3 days. °· You have a fever and your symptoms suddenly get worse. °MAKE SURE YOU:  °· Understand these instructions. °· Will watch your condition. °· Will get help right away if you are not doing well or get worse. °  °This information is not intended to replace advice given to you by your health care provider. Make sure you discuss any questions you have with your health care provider. °  °Document Released: 10/12/2004 Document Revised: 09/25/2014 Document Reviewed: 05/12/2012 °Elsevier Interactive Patient Education ©2016 [employer] ° °

## 2015-12-17 LAB — CULTURE, GROUP A STREP (THRC)

## 2015-12-18 NOTE — Progress Notes (Signed)
ED Antimicrobial Stewardship Positive Culture Follow Up   Gene JewelStacey G Myer is an 50 y.o. male [facility] with a chief complaint of  Chief Complaint  Patient presents with  . Sore Throat    Recent Results (from the past 720 hour(s))  Culture, group A strep     Status: None   Collection Time: 12/15/15  5:52 AM  Result Value Ref Range Status   Specimen Description THROAT  Final   Special Requests NONE  Final   Culture   Final    MODERATE GROWTH GROUP A STREP (S.PYOGENES) ISOLATED There is no known Penicillin Resistant Beta Streptococcus in the [place] For patients that are Penicillin-allergic, Erythromycin is 85-94% susceptible, and Clindamycin is 80% susceptible.  Contact Microbiology within 7 days if sensitivity testing is  required.      Report Status 12/17/2015 FINAL  Final     [x]  Patient discharged originally without antimicrobial agent and treatment is now indicated  New antibiotic prescription: Amoxicillin 500 mg PO BID x 10 days  ED Provider: Dr. Dorothea GlassmanPaul Malinda   Wiley Magan G 12/18/2015, 11:20 AM

## 2016-05-09 ENCOUNTER — Emergency Department: Payer: Worker's Compensation

## 2016-05-09 ENCOUNTER — Emergency Department
Admission: EM | Admit: 2016-05-09 | Discharge: 2016-05-09 | Disposition: A | Discharge status: Home / Self Care | Payer: Worker's Compensation | Attending: Emergency Medicine | Admitting: Emergency Medicine

## 2016-05-09 ENCOUNTER — Encounter: Payer: Self-pay | Admitting: Emergency Medicine

## 2016-05-09 DIAGNOSIS — Y9389 Activity, other specified: Secondary | ICD-10-CM | POA: Insufficient documentation

## 2016-05-09 DIAGNOSIS — S60221A Contusion of right hand, initial encounter: Secondary | ICD-10-CM | POA: Insufficient documentation

## 2016-05-09 DIAGNOSIS — S6991XA Unspecified injury of right wrist, hand and finger(s), initial encounter: Secondary | ICD-10-CM | POA: Diagnosis present

## 2016-05-09 DIAGNOSIS — Y929 Unspecified place or not applicable: Secondary | ICD-10-CM | POA: Insufficient documentation

## 2016-05-09 DIAGNOSIS — Y990 Civilian activity done for income or pay: Secondary | ICD-10-CM | POA: Insufficient documentation

## 2016-05-09 DIAGNOSIS — S60211A Contusion of right wrist, initial encounter: Secondary | ICD-10-CM | POA: Diagnosis not present

## 2016-05-09 DIAGNOSIS — Z79899 Other long term (current) drug therapy: Secondary | ICD-10-CM | POA: Diagnosis not present

## 2016-05-09 DIAGNOSIS — W208XXA Other cause of strike by thrown, projected or falling object, initial encounter: Secondary | ICD-10-CM | POA: Diagnosis not present

## 2016-05-09 MED ORDER — NAPROXEN 500 MG PO TABS
500.0000 mg | ORAL_TABLET | Freq: Once | ORAL | Status: AC
  Administered 2016-05-09: 500 mg via ORAL
  Filled 2016-05-09: qty 1

## 2016-05-09 MED ORDER — NAPROXEN 500 MG PO TABS: 500.0000 mg | ORAL_TABLET | Freq: Two times a day (BID) | ORAL | 0 refills | Status: AC

## 2016-05-09 NOTE — ED Notes (Signed)
See triage note  States he was working and someone cut down a hog and it hit his right hand  Right hand slightly swollen and tender

## 2016-05-09 NOTE — ED Provider Notes (Signed)
[facility]-EMERGENCY DEPARTMENT Provider Note   CSN: [id number] Arrival date & time: 05/09/16  [id number]     History   Chief Complaint Chief Complaint  Patient presents with  . Hand Injury    HPI [name] is a 50 y.o. male.  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 Pt states that one of his coworkers cut down a 160 pound hog from where it was hanging and it fell directly onto his right hand and wrist- crushing them. Has significant pain with ROM of right wrist and hand. No history of prior injury.    The history is provided by the patient.  Hand Injury   The incident occurred yesterday. The incident occurred at work. The injury mechanism was a direct blow. The pain is present in the right forearm, right hand, right wrist and right fingers. The quality of the pain is described as sharp. The pain is moderate. The pain has been constant since the incident. The symptoms are aggravated by movement, use and palpation. He has tried ice for the symptoms. The treatment provided mild relief.    No past medical history on file.  There are no active problems to display for this patient.   Past Surgical History:  Procedure Laterality Date  . LITHOTRIPSY         Home Medications    Prior to Admission medications   Medication Sig Start Date End Date Taking? Authorizing Provider  naproxen (NAPROSYN) 500 MG tablet Take 1 tablet (500 mg total) by mouth 2 (two) times daily with a meal. 05/09/16 05/24/16  Christella ScheuermannEmma V Kaysea Raya, PA-C  ranitidine (ZANTAC) 150 MG capsule Take 1 capsule (150 mg total) by mouth 2 (two) times daily. 05/28/15   Sharman CheekPhillip Stafford, MD  sucralfate (CARAFATE) 1 G tablet Take 1 tablet (1 g total) by mouth 4 (four) times daily. 05/28/15   Sharman CheekPhillip Stafford, MD    Family History No family history on file.  Social  History Social History  Substance Use Topics  . Smoking status: Never Smoker  . Smokeless tobacco: Not on file  . Alcohol use Yes     Allergies   Review of patient's allergies indicates no known allergies.   Review of Systems Review of Systems  Musculoskeletal: Positive for arthralgias, joint swelling and myalgias.  Skin: Negative for wound.  All other systems reviewed and are negative.    Physical Exam Updated Vital Signs BP (!) 141/86 (BP Location: Left Arm)   Pulse (!) 53   Temp 97.8 F (36.6 C) (Oral)   Resp 18   Ht 5\' 10"  (1.778 m)   Wt 86.2 kg   SpO2 100%   BMI 27.26 kg/m   Physical Exam  Constitutional: He is oriented to person, place, and time. He appears well-developed and well-nourished.  Musculoskeletal: He exhibits tenderness. He exhibits no deformity.       Right wrist: He exhibits decreased range of motion, tenderness, bony tenderness and swelling.       Right forearm: He exhibits tenderness and swelling. He exhibits no deformity and no laceration.       Right hand: He exhibits decreased range of motion, tenderness and swelling. He exhibits normal capillary refill and no deformity.  There is mild swelling to distal forearm, wrist joint and right hand. NV intact distal to injury   Neurological: He  is alert and oriented to person, place, and time.  Skin: Skin is warm and dry. Capillary refill takes less than 2 seconds.  Nursing note and vitals reviewed.    ED Treatments / Results  Labs (all labs ordered are listed, but only abnormal results are displayed) Labs Reviewed - No data to display  EKG  EKG Interpretation None       Radiology Dg Forearm Right  Result Date: 05/09/2016 CLINICAL DATA:  Pig fell onto arm while at work.  Forearm pain. EXAM: RIGHT FOREARM - 2 VIEW COMPARISON:  None. FINDINGS: There is no evidence of fracture or other focal bone lesions. Soft tissues are unremarkable. IMPRESSION: Negative. Electronically Signed   By: Paulina FusiMark   Shogry M.D.   On: 05/09/2016 08:00   Dg Hand Complete Right  Result Date: 05/09/2016 CLINICAL DATA:  Pig fell on hand at work.  Lateral pain. EXAM: RIGHT HAND - COMPLETE 3+ VIEW COMPARISON:  None. FINDINGS: There is no evidence of fracture or dislocation. There is no evidence of arthropathy or other focal bone abnormality. Soft tissues are unremarkable. IMPRESSION: Normal radiographs. Electronically Signed   By: Paulina FusiMark  Shogry M.D.   On: 05/09/2016 07:41    Procedures Procedures (including critical care time)  Medications Ordered in ED Medications  naproxen (NAPROSYN) tablet 500 mg (not administered)     Initial Impression / Assessment and Plan / ED Course  I have reviewed the triage vital signs and the nursing notes.  Pertinent labs & imaging results that were available during my care of the patient were reviewed by me and considered in my medical decision making (see chart for details).  Clinical Course  Value Comment By Time  DG Forearm Right (Reviewed) Christella ScheuermannEmma V Hurschel Paynter, PA-C 08/22 0751   Pt w/ crush injury with normal xrays of hand and wrist.  Has minimal swelling at wrist and hand. Will provide wrist splint to stabilize joint, naproxen 500mg  BId with food. Worker's [employer] Officer[employer] info completed, pt will need to follow up with orthopedics for work restrictions. Keep elevated at home and ice 15-20 minutes at a time.   Final Clinical Impressions(s) / ED Diagnoses   Final diagnoses:  Hand contusion, right, initial encounter  Wrist contusion, right, initial encounter    New Prescriptions New Prescriptions   NAPROXEN (NAPROSYN) 500 MG TABLET    Take 1 tablet (500 mg total) by mouth 2 (two) times daily with a meal.     Christella ScheuermannEmma V Alaycia Eardley, PA-C 05/09/16 0825    Emily FilbertJonathan E Williams, MD 05/09/16 1014

## 2016-05-09 NOTE — ED Triage Notes (Signed)
Pt states a pig fell on his right hand at work yesterday

## 2017-01-23 ENCOUNTER — Emergency Department
Admission: EM | Admit: 2017-01-23 | Discharge: 2017-01-23 | Disposition: A | Discharge status: Home / Self Care | Payer: Self-pay | Attending: Emergency Medicine | Admitting: Emergency Medicine

## 2017-01-23 DIAGNOSIS — T7840XA Allergy, unspecified, initial encounter: Secondary | ICD-10-CM | POA: Insufficient documentation

## 2017-01-23 MED ORDER — FAMOTIDINE IN NACL 20-0.9 MG/50ML-% IV SOLN
20.0000 mg | Freq: Once | INTRAVENOUS | Status: AC
  Administered 2017-01-23: 20 mg via INTRAVENOUS
  Filled 2017-01-23: qty 50

## 2017-01-23 MED ORDER — METHYLPREDNISOLONE SODIUM SUCC 125 MG IJ SOLR
125.0000 mg | Freq: Once | INTRAMUSCULAR | Status: AC
  Administered 2017-01-23: 125 mg via INTRAVENOUS
  Filled 2017-01-23: qty 2

## 2017-01-23 MED ORDER — PREDNISONE 20 MG PO TABS: 60.0000 mg | ORAL_TABLET | Freq: Every day | ORAL | 0 refills | Status: AC

## 2017-01-23 MED ORDER — EPINEPHRINE 0.3 MG/0.3ML IJ SOAJ: 0.3000 mg | Freq: Once | INTRAMUSCULAR | 0 refills | Status: AC

## 2017-01-23 MED ORDER — AZITHROMYCIN 250 MG PO TABS: ORAL_TABLET | ORAL | 0 refills | Status: AC

## 2017-01-23 NOTE — ED Triage Notes (Signed)
Pt states he started to get a rash all over his body at approx 1130pm last night.  [facility].  Given 50mg  IV benadryl in route.  Pt A&Ox4.  Pt breathing even and non labored at this time with mild dry hacking cough noted.

## 2017-01-23 NOTE — ED Notes (Signed)
Pt discharged to home.  Family member driving.  Discharge instructions reviewed.  Verbalized understanding.  No questions or concerns at this time.  Teach back verified.  Pt in NAD.  No items left in ED.   

## 2017-01-23 NOTE — ED Provider Notes (Signed)
[facility] Emergency Department Provider Note    First MD Initiated Contact with Patient 01/23/17 0006     (approximate)  I have reviewed the triage vital signs and the nursing notes.   HISTORY  Chief Complaint No chief complaint on file.    HPI [name] is a 51 y.o. male presents with acute onset of generalized pruritus and rash approximately one hour before presenting to the emergency department. Patient denies any known history of allergies. Patient denies any new medications or food ingestion. Patient does admit to some difficulty swallowing that is described as mild.   Past medical history None There are no active problems to display for this patient.   Past Surgical History:  Procedure Laterality Date  . LITHOTRIPSY      Prior to Admission medications   Medication Sig Start Date End Date Taking? Authorizing Provider  ranitidine (ZANTAC) 150 MG capsule Take 1 capsule (150 mg total) by mouth 2 (two) times daily. 05/28/15   Sharman Cheek, MD  sucralfate (CARAFATE) 1 G tablet Take 1 tablet (1 g total) by mouth 4 (four) times daily. 05/28/15   Sharman Cheek, MD    Allergies No known drug allergies No family history on file.  Social History Social History  Substance Use Topics  . Smoking status: Never Smoker  . Smokeless tobacco: Not on file  . Alcohol use Yes    Review of Systems Constitutional: No fever/chills Eyes: No visual changes. ENT: No sore throat. Cardiovascular: Denies chest pain. Respiratory: Denies shortness of breath. Gastrointestinal: No abdominal pain.  No nausea, no vomiting.  No diarrhea.  No constipation. Genitourinary: Negative for dysuria. Musculoskeletal: Negative for back pain. Integumentary: Positive for pruritic rash Neurological: Negative for headaches, focal weakness or numbness.   ____________________________________________   PHYSICAL EXAM:  VITAL SIGNS: ED Triage Vitals  Enc Vitals  Group     BP      Pulse      Resp      Temp      Temp src      SpO2      Weight      Height      Head Circumference      Peak Flow      Pain Score      Pain Loc      Pain Edu?      Excl. in GC?     Constitutional: Alert and oriented. Well appearing and in no acute distress. Eyes: Conjunctivae are normal. PERRL. EOMI. Head: Atraumatic. Mouth/Throat: Mucous membranes are moist.  Oropharynx non-erythematous. Neck: No stridor.   Cardiovascular: Normal rate, regular rhythm. Good peripheral circulation. Grossly normal heart sounds. Respiratory: Normal respiratory effort.  No retractions. Lungs CTAB. Gastrointestinal: Soft and nontender. No distention.  Musculoskeletal: No lower extremity tenderness nor edema. No gross deformities of extremities. Neurologic:  Normal speech and language. No gross focal neurologic deficits are appreciated.  Skin:  Hives noted to the patient's face and trunk Psychiatric: Mood and affect are normal. Speech and behavior are normal.      Procedures   ____________________________________________   INITIAL IMPRESSION / ASSESSMENT AND PLAN / ED COURSE  Pertinent labs & imaging results that were available during my care of the patient were reviewed by me and considered in my medical decision making (see chart for details).  Patient given 50 mg IV Benadryl by EMS. Patient was given an 25 mg Solu-Medrol as well as 20 mg of Pepcid in the emergency  department with complete resolution of all symptoms.      ____________________________________________  FINAL CLINICAL IMPRESSION(S) / ED DIAGNOSES  Final diagnoses:  Allergic reaction, initial encounter     MEDICATIONS GIVEN DURING THIS VISIT:  Medications  methylPREDNISolone sodium succinate (SOLU-MEDROL) 125 mg/2 mL injection 125 mg (not administered)  famotidine (PEPCID) IVPB 20 mg premix (not administered)     NEW OUTPATIENT MEDICATIONS STARTED DURING THIS VISIT:  New Prescriptions    No medications on file    Modified Medications   No medications on file    Discontinued Medications   No medications on file     Note:  This document was prepared using Dragon voice recognition software and may include unintentional dictation errors.    Darci CurrentBrown, [place] N, MD 01/23/17 [phone]

## 2017-01-23 NOTE — ED Notes (Signed)
Pt reports decreased itching, visually decreased swelling and decreased shortness of breath.

## 2017-03-07 ENCOUNTER — Emergency Department
Admission: EM | Admit: 2017-03-07 | Discharge: 2017-03-07 | Disposition: A | Discharge status: Home / Self Care | Payer: Self-pay | Attending: Student in an Organized Health Care Education/Training Program | Admitting: Student in an Organized Health Care Education/Training Program

## 2017-03-07 ENCOUNTER — Encounter: Payer: Self-pay | Admitting: Emergency Medicine

## 2017-03-07 ENCOUNTER — Emergency Department: Payer: Self-pay

## 2017-03-07 DIAGNOSIS — M79605 Pain in left leg: Secondary | ICD-10-CM | POA: Insufficient documentation

## 2017-03-07 DIAGNOSIS — Z79899 Other long term (current) drug therapy: Secondary | ICD-10-CM | POA: Insufficient documentation

## 2017-03-07 DIAGNOSIS — Z1810 Retained metal fragments, unspecified: Secondary | ICD-10-CM | POA: Insufficient documentation

## 2017-03-07 DIAGNOSIS — M545 Low back pain, unspecified: Secondary | ICD-10-CM

## 2017-03-07 MED ORDER — HYDROCODONE-ACETAMINOPHEN 5-325 MG PO TABS: 1.0000 | ORAL_TABLET | ORAL | 0 refills | Status: DC | PRN

## 2017-03-07 MED ORDER — NAPROXEN 500 MG PO TABS: 500.0000 mg | ORAL_TABLET | Freq: Two times a day (BID) | ORAL | 0 refills | Status: DC

## 2017-03-07 MED ORDER — HYDROCODONE-ACETAMINOPHEN 5-325 MG PO TABS
1.0000 | ORAL_TABLET | Freq: Once | ORAL | Status: AC
  Administered 2017-03-07: 1 via ORAL
  Filled 2017-03-07: qty 1

## 2017-03-07 MED ORDER — CYCLOBENZAPRINE HCL 10 MG PO TABS: 10.0000 mg | ORAL_TABLET | Freq: Three times a day (TID) | ORAL | 0 refills | Status: DC | PRN
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
## 2017-06-27 ENCOUNTER — Emergency Department
Admission: EM | Admit: 2017-06-27 | Discharge: 2017-06-27 | Disposition: A | Discharge status: Home / Self Care | Payer: Self-pay | Attending: Emergency Medicine | Admitting: Emergency Medicine

## 2017-06-27 ENCOUNTER — Emergency Department: Payer: Self-pay

## 2017-06-27 DIAGNOSIS — A09 Infectious gastroenteritis and colitis, unspecified: Secondary | ICD-10-CM | POA: Insufficient documentation

## 2017-06-27 DIAGNOSIS — Z79899 Other long term (current) drug therapy: Secondary | ICD-10-CM | POA: Insufficient documentation

## 2017-06-27 LAB — COMPREHENSIVE METABOLIC PANEL
ALT: 29 U/L (ref 17–63)
AST: 35 U/L (ref 15–41)
Albumin: 4.6 g/dL (ref 3.5–5.0)
Alkaline Phosphatase: 61 U/L (ref 38–126)
Anion gap: 10 (ref 5–15)
BUN: 11 mg/dL (ref 6–20)
CHLORIDE: 104 mmol/L (ref 101–111)
CO2: 23 mmol/L (ref 22–32)
CREATININE: 1.37 mg/dL — AB (ref 0.61–1.24)
Calcium: 9.3 mg/dL (ref 8.9–10.3)
GFR calc Af Amer: >60 mL/min (ref >60)
GFR calc non Af Amer: 58 mL/min — ABNORMAL LOW (ref >60)
GLUCOSE: 161 mg/dL — AB (ref 65–99)
Potassium: 3.8 mmol/L (ref 3.5–5.1)
SODIUM: 137 mmol/L (ref 135–145)
Total Bilirubin: 0.8 mg/dL (ref 0.3–1.2)
Total Protein: 8.4 g/dL — ABNORMAL HIGH (ref 6.5–8.1)

## 2017-06-27 LAB — CBC
HCT: 43.2 % (ref 40.0–52.0)
Hemoglobin: 15 g/dL (ref 13.0–18.0)
MCH: 32.4 pg (ref 26.0–34.0)
MCHC: 34.6 g/dL (ref 32.0–36.0)
MCV: 93.6 fL (ref 80.0–100.0)
PLATELETS: 150 10*3/uL (ref 150–440)
RBC: 4.62 MIL/uL (ref 4.40–5.90)
RDW: 13.5 % (ref 11.5–14.5)
WBC: 7.6 10*3/uL (ref 3.8–10.6)

## 2017-06-27 LAB — TROPONIN I: Troponin I: <0.03 ng/mL (ref <0.03)

## 2017-06-27 MED ORDER — ONDANSETRON HCL 4 MG/2ML IJ SOLN
4.0000 mg | Freq: Once | INTRAMUSCULAR | Status: AC
  Administered 2017-06-27: 4 mg via INTRAVENOUS

## 2017-06-27 MED ORDER — ONDANSETRON 4 MG PO TBDP: 4.0000 mg | ORAL_TABLET | Freq: Three times a day (TID) | ORAL | 0 refills | Status: DC | PRN

## 2017-06-27 MED ORDER — IOPAMIDOL (ISOVUE-300) INJECTION 61%
100.0000 mL | Freq: Once | INTRAVENOUS | Status: AC | PRN
  Administered 2017-06-27: 100 mL via INTRAVENOUS

## 2017-06-27 MED ORDER — SODIUM CHLORIDE 0.9 % IV BOLUS (SEPSIS)
1000.0000 mL | Freq: Once | INTRAVENOUS | Status: AC
  Administered 2017-06-27: 1000 mL via INTRAVENOUS

## 2017-06-27 MED ORDER — LOPERAMIDE HCL 2 MG PO CAPS
4.0000 mg | ORAL_CAPSULE | Freq: Once | ORAL | Status: AC
  Administered 2017-06-27: 4 mg via ORAL
  Filled 2017-06-27: qty 2

## 2017-06-27 MED ORDER — ONDANSETRON HCL 4 MG/2ML IJ SOLN
INTRAMUSCULAR | Status: AC
  Administered 2017-06-27: 4 mg via INTRAVENOUS
  Filled 2017-06-27: qty 2

## 2017-06-27 NOTE — ED Triage Notes (Signed)
Patient with onset of  Vomiting yesterday. Unable to keep fluids down and appetite is decreased. Normal color for ethnicity. Patient's wife states earlier in the night he was sitting on the toilet and was "pouring sweat like it just got out of the shower." Patient said he felt short or breath at the time.

## 2017-06-27 NOTE — ED Provider Notes (Signed)
[facility] Emergency Department Provider Note   First MD Initiated Contact with Patient 06/27/17 0408     (approximate)  I have reviewed the triage vital signs and the nursing notes.   HISTORY  Chief Complaint Emesis   HPI [name] is a 51 y.o. male presents with history of acute onset of nonbloody vomiting and diarrhea with inability to tolerate liquids by mouth at home. Patient admits to generalized abdominal discomfort with current pain score of 8 out of 10. Patient denies any fever. Patient states no other sick contact at home with similar symptoms. Patient denies any urinary symptoms   History reviewed. No pertinent past medical history.  There are no active problems to display for this patient.   Past Surgical History:  Procedure Laterality Date  . LITHOTRIPSY      Prior to Admission medications   Medication Sig Start Date End Date Taking? Authorizing Provider  cyclobenzaprine (FLEXERIL) 10 MG tablet Take 1 tablet (10 mg total) by mouth 3 (three) times daily as needed for muscle spasms. 03/07/17   Willy Eddy, MD  HYDROcodone-acetaminophen (NORCO) 5-325 MG tablet Take 1 tablet by mouth every 4 (four) hours as needed for moderate pain. 03/07/17   Willy Eddy, MD  naproxen (NAPROSYN) 500 MG tablet Take 1 tablet (500 mg total) by mouth 2 (two) times daily with a meal. 03/07/17 03/07/18  Willy Eddy, MD  ondansetron (ZOFRAN ODT) 4 MG disintegrating tablet Take 1 tablet (4 mg total) by mouth every 8 (eight) hours as needed for nausea or vomiting. 06/27/17   Darci Current, MD  ranitidine (ZANTAC) 150 MG capsule Take 1 capsule (150 mg total) by mouth 2 (two) times daily. 05/28/15   Sharman Cheek, MD  sucralfate (CARAFATE) 1 G tablet Take 1 tablet (1 g total) by mouth 4 (four) times daily. 05/28/15   Sharman Cheek, MD    Allergies No known drug allergies No family history on file.  Social History Social History    Substance Use Topics  . Smoking status: Never Smoker  . Smokeless tobacco: Never Used  . Alcohol use Yes    Review of Systems Constitutional: No fever/chills Eyes: No visual changes. ENT: No sore throat. Cardiovascular: Denies chest pain. Respiratory: Denies shortness of breath. Gastrointestinal:Positive for generalized abdominal pain and vomiting and diarrhea  Genitourinary: Negative for dysuria. Musculoskeletal: Negative for neck pain.  Negative for back pain. Integumentary: Negative for rash. Neurological: Negative for headaches, focal weakness or numbness.   ____________________________________________   PHYSICAL EXAM:  VITAL SIGNS: ED Triage Vitals  Enc Vitals Group     BP 06/27/17 0043 108/73     Pulse Rate 06/27/17 0043 91     Resp 06/27/17 0043 18     Temp 06/27/17 0043 99.3 F (37.4 C)     Temp Source 06/27/17 0043 Oral     SpO2 06/27/17 0043 99 %     Weight 06/27/17 0044 90.7 kg (200 lb)     Height 06/27/17 0044 1.778 m ( )     Head Circumference --      Peak Flow --      Pain Score 06/27/17 0043 5     Pain Loc --      Pain Edu? --      Excl. in GC? --     Constitutional: Alert and oriented. Well appearing and in no acute distress. Eyes: Conjunctivae are normal.  Head: Atraumatic.Marland Kitchen Mouth/Throat: Mucous membranes are moist. Oropharynx non-erythematous. Neck: No  stridor.   Cardiovascular: Normal rate, regular rhythm. Good peripheral circulation. Grossly normal heart sounds. Respiratory: Normal respiratory effort.  No retractions. Lungs CTAB. Gastrointestinal: Positive for generalized tenderness to palpation. Musculoskeletal: No lower extremity tenderness nor edema. No gross deformities of extremities. Neurologic:  Normal speech and language. No gross focal neurologic deficits are appreciated.  Skin:  Skin is warm, dry and intact. No rash noted. Psychiatric: Mood and affect are normal. Speech and behavior are  normal.  ____________________________________________   LABS (all labs ordered are listed, but only abnormal results are displayed)  Labs Reviewed  COMPREHENSIVE METABOLIC PANEL - Abnormal; Notable for the following:       Result Value   Glucose, Bld 161 (*)    Creatinine, Ser 1.37 (*)    Total Protein 8.4 (*)    GFR calc non Af Amer 58 (*)    All other components within normal limits  GASTROINTESTINAL PANEL BY PCR, STOOL (REPLACES STOOL CULTURE)  C DIFFICILE QUICK SCREEN W PCR REFLEX  CBC  TROPONIN I    RADIOLOGY I, Apollo N BROWN, personally viewed and evaluated these images (plain radiographs) as part of my medical decision making, as well as reviewing the written report by the radiologist.  Ct Abdomen Pelvis W Contrast  Result Date: 06/27/2017 CLINICAL DATA:  Acute onset of vomiting and decreased appetite. Diaphoresis. Initial encounter. EXAM: CT ABDOMEN AND PELVIS WITH CONTRAST TECHNIQUE: Multidetector CT imaging of the abdomen and pelvis was performed using the standard protocol following bolus administration of intravenous contrast. CONTRAST:  ISOVUE-300 IOPAMIDOL (ISOVUE-300) INJECTION 61% COMPARISON:  CT of the chest, abdomen and pelvis performed 07/01/2013 FINDINGS: Lower chest: The visualized lung bases are grossly clear. The visualized portions of the mediastinum are unremarkable. Hepatobiliary: The liver is unremarkable in appearance. The gallbladder is unremarkable in appearance. The common bile duct remains normal in caliber. Pancreas: The pancreas is within normal limits. Spleen: The spleen is unremarkable in appearance. Adrenals/Urinary Tract: The adrenal glands are unremarkable in appearance. A small right renal cyst is noted. There is no evidence of hydronephrosis. No renal or ureteral stones are identified. No perinephric stranding is seen. Stomach/Bowel: The stomach is unremarkable in appearance. The small bowel is within normal limits. The appendix is normal  in caliber, without evidence of appendicitis. The colon is unremarkable in appearance. Vascular/Lymphatic: The abdominal aorta is unremarkable in appearance. A retroaortic left renal vein is noted. The inferior vena cava is grossly unremarkable. No retroperitoneal lymphadenopathy is seen. No pelvic sidewall lymphadenopathy is identified. Reproductive: The bladder is mildly distended and grossly unremarkable. The prostate remains normal in size. Other: No additional soft tissue abnormalities are seen. Musculoskeletal: No acute osseous abnormalities are identified. The visualized musculature is unremarkable in appearance. IMPRESSION: 1. No acute abnormality seen within the abdomen or pelvis. 2. Small right renal cyst. Electronically Signed   By: Roanna Raider M.D.   On: 06/27/2017 05:14    _________________  Procedures   ____________________________________________   INITIAL IMPRESSION / ASSESSMENT AND PLAN / ED COURSE  As part of my medical decision making, I reviewed the following data within the electronic MEDICAL RECORD NUMBER25 year old male presenting with above stated history of physical exam concerning for possible infectious diarrhea versus diverticulitis/gastroenteritis. Patient given 2 L IV normal saline CT scan of the abdomen and pelvis was performed which revealed no acute intra-abdominal pathology in the abdomen and pelvis. Stool cultures were ordered however patient had no further bowel movements while in the emergency department and a such  they were not obtained. Patient will be discharged home with Zofran with recommendation to take Imodium secondary to infectious diarrhea        ____________________________________________  FINAL CLINICAL IMPRESSION(S) / ED DIAGNOSES  Final diagnoses:  Diarrhea of infectious origin     MEDICATIONS GIVEN DURING THIS VISIT:  Medications  sodium chloride 0.9 % bolus 1,000 mL (0 mLs Intravenous Stopped 06/27/17 0627)  sodium chloride 0.9 %  bolus 1,000 mL (0 mLs Intravenous Stopped 06/27/17 0627)  ondansetron (ZOFRAN) injection 4 mg (4 mg Intravenous Given 06/27/17 0433)  iopamidol (ISOVUE-300) 61 % injection 100 mL (100 mLs Intravenous Contrast Given 06/27/17 0446)  loperamide (IMODIUM) capsule 4 mg (4 mg Oral Given 06/27/17 0627)     NEW OUTPATIENT MEDICATIONS STARTED DURING THIS VISIT:  New Prescriptions   ONDANSETRON (ZOFRAN ODT) 4 MG DISINTEGRATING TABLET    Take 1 tablet (4 mg total) by mouth every 8 (eight) hours as needed for nausea or vomiting.    Modified Medications   No medications on file    Discontinued Medications   No medications on file     Note:  This document was prepared using Dragon voice recognition software and may include unintentional dictation errors.    Darci Current, MD 06/27/17 [phone]

## 2017-11-26 ENCOUNTER — Encounter: Payer: Self-pay | Admitting: Emergency Medicine

## 2017-11-26 ENCOUNTER — Emergency Department
Admission: EM | Admit: 2017-11-26 | Discharge: 2017-11-26 | Disposition: A | Discharge status: Home / Self Care | Payer: 59 | Attending: Emergency Medicine | Admitting: Emergency Medicine

## 2017-11-26 ENCOUNTER — Other Ambulatory Visit: Payer: Self-pay

## 2017-11-26 ENCOUNTER — Emergency Department: Payer: 59

## 2017-11-26 DIAGNOSIS — M545 Low back pain, unspecified: Secondary | ICD-10-CM

## 2017-11-26 DIAGNOSIS — Y9301 Activity, walking, marching and hiking: Secondary | ICD-10-CM | POA: Insufficient documentation

## 2017-11-26 DIAGNOSIS — Y929 Unspecified place or not applicable: Secondary | ICD-10-CM | POA: Diagnosis not present

## 2017-11-26 DIAGNOSIS — W2203XA Walked into furniture, initial encounter: Secondary | ICD-10-CM | POA: Diagnosis not present

## 2017-11-26 DIAGNOSIS — S92522A Displaced fracture of medial phalanx of left lesser toe(s), initial encounter for closed fracture: Secondary | ICD-10-CM | POA: Diagnosis not present

## 2017-11-26 DIAGNOSIS — Y999 Unspecified external cause status: Secondary | ICD-10-CM | POA: Diagnosis not present

## 2017-11-26 MED ORDER — BACLOFEN 10 MG PO TABS: 10.0000 mg | ORAL_TABLET | Freq: Every day | ORAL | 1 refills | Status: DC

## 2017-11-26 MED ORDER — MELOXICAM 15 MG PO TABS: 15.0000 mg | ORAL_TABLET | Freq: Every day | ORAL | 2 refills | Status: DC

## 2017-11-26 NOTE — ED Provider Notes (Signed)
[facility] Emergency Department Provider Note  ____________________________________________   First MD Initiated Contact with Patient 11/26/17 1248     (approximate)  I have reviewed the triage vital signs and the nursing notes.   HISTORY  Chief Complaint Back Pain and Foot Pain    HPI [name] is a 52 y.o. male resents emergency department complaining of left toe pain he hit it on a piece of furniture.  Also he complains of lower back pain.  He has had this for a while.  It does not radiate into the legs.  Just hurts when he goes from sitting to standing.  He denies any known injury.  He is taking over-the-counter medications without any relief.  He denies any abdominal pain  History reviewed. No pertinent past medical history.  There are no active problems to display for this patient.   Past Surgical History:  Procedure Laterality Date  . LITHOTRIPSY      Prior to Admission medications   Medication Sig Start Date End Date Taking? Authorizing Provider  baclofen (LIORESAL) 10 MG tablet Take 1 tablet (10 mg total) by mouth daily. 11/26/17 11/26/18  Kevis Qu, Roselyn BeringSusan W, PA-C  meloxicam (MOBIC) 15 MG tablet Take 1 tablet (15 mg total) by mouth daily. 11/26/17 11/26/18  Faythe GheeFisher, Veleta Yamamoto W, PA-C    Allergies Patient has no known allergies.  No family history on file.  Social History Social History   Tobacco Use  . Smoking status: Never Smoker  . Smokeless tobacco: Never Used  Substance Use Topics  . Alcohol use: Yes  . Drug use: No    Review of Systems  Constitutional: No fever/chills Eyes: No visual changes. ENT: No sore throat. Respiratory: Denies cough Genitourinary: Negative for dysuria. Musculoskeletal: Positive for back pain.  Positive for left foot pain Skin: Negative for rash.    ____________________________________________   PHYSICAL EXAM:  VITAL SIGNS: ED Triage Vitals  Enc Vitals Group     BP 11/26/17 1207 (!) 147/97       Pulse Rate 11/26/17 1207 66     Resp 11/26/17 1207 16     Temp 11/26/17 1207 98.2 F (36.8 C)     Temp Source 11/26/17 1207 Oral     SpO2 11/26/17 1207 97 %     Weight 11/26/17 1206 190 lb (86.2 kg)     Height 11/26/17 1206 5\' 10"  (1.778 m)     Head Circumference --      Peak Flow --      Pain Score 11/26/17 1206 8     Pain Loc --      Pain Edu? --      Excl. in GC? --     Constitutional: Alert and oriented. Well appearing and in no acute distress. Eyes: Conjunctivae are normal.  Head: Atraumatic. Nose: No congestion/rhinnorhea. Mouth/Throat: Mucous membranes are moist.   Cardiovascular: Normal rate, regular rhythm.  Heart sounds are normal Respiratory: Normal respiratory effort.  No retractions, lungs clear to auscultation GU: deferred Musculoskeletal: FROM all extremities, warm and well perfused.  The left third toe is bruised and swollen.  Tender to palpation.  The lumbar spine is tender in the paravertebral muscles.  Negative straight leg raise.  Patient is able to ambulate without difficulty. Neurologic:  Normal speech and language.  Skin:  Skin is warm, dry and intact. No rash noted. Psychiatric: Mood and affect are normal. Speech and behavior are normal.  ____________________________________________   LABS (all labs ordered are  listed, but only abnormal results are displayed)  Labs Reviewed - No data to display ____________________________________________   ____________________________________________  RADIOLOGY  X-ray of the left foot shows a fracture of the left third toe  ____________________________________________   PROCEDURES  Procedure(s) performed: Toes were buddy taped by the tech  Procedures    ____________________________________________   INITIAL IMPRESSION / ASSESSMENT AND PLAN / ED COURSE  Pertinent labs & imaging results that were available during my care of the patient were reviewed by me and considered in my medical decision  making (see chart for details).  Patient is a 52 year old male complaining of left foot and low back pain.    On physical exam the left foot shows a swollen middle toe.  The lumbar spine is not tender but the paravertebral muscles are tender and spasmed.  X-ray of the left foot showed a fracture to the left middle toe  Results were discussed with the patient.  Tech applied buddy tape to the toes.  He was given a muscle relaxer and an anti-inflammatory for his back pain.  He is to follow-up with Dr. Rosita Kea for evaluation of the chronic back pain.  He was given a work note as he works in [employer].  He is to take medications as prescribed.  He states he understands and will comply with our treatment plan.  He was discharged in stable condition     As part of my medical decision making, I reviewed the following data within the electronic MEDICAL RECORD NUMBER Nursing notes reviewed and incorporated, Radiograph reviewed x-ray left foot shows a fracture to the left middle to, Notes from prior ED visits and [place] Controlled Substance Database  ____________________________________________   FINAL CLINICAL IMPRESSION(S) / ED DIAGNOSES  Final diagnoses:  Closed displaced fracture of middle phalanx of lesser toe of left foot, initial encounter  Acute midline low back pain without sciatica      NEW MEDICATIONS STARTED DURING THIS VISIT:  Discharge Medication List as of 11/26/2017  2:13 PM    START taking these medications   Details  baclofen (LIORESAL) 10 MG tablet Take 1 tablet (10 mg total) by mouth daily., Starting Mon 11/26/2017, Until Tue 11/26/2018, Print    meloxicam (MOBIC) 15 MG tablet Take 1 tablet (15 mg total) by mouth daily., Starting Mon 11/26/2017, Until Tue 11/26/2018, Print         Note:  This document was prepared using Dragon voice recognition software and may include unintentional dictation errors.    Faythe Ghee, PA-C 11/26/17 1547    Jene Every,  MD 11/27/17 [phone]

## 2017-11-26 NOTE — Discharge Instructions (Signed)
Follow-up with your regular doctor or Dr. Rosita KeaMenz, call for an appointment.  You have a broken third toe and back pain.  You have been given an anti-inflammatory for the back pain along with a muscle relaxer.  Keep the toes buddy taped for 3 weeks.  If you are worsening please return the emergency department

## 2017-11-26 NOTE — ED Notes (Signed)
See triage note  Presents with lower back pain which is non radiating   States he developed pain after moving furniture  Also states he kicked something  Having pain to left foot  Bruising noted to 3 rd toe

## 2017-11-26 NOTE — ED Triage Notes (Signed)
C/O lower back x 1 week.  Also, kicked a chair with left foot yesterday, c/o pain and swelling to foot.

## 2017-11-26 NOTE — ED Notes (Signed)
Left second third and ffourth toes buddy taped.

## 2018-06-10 ENCOUNTER — Other Ambulatory Visit: Payer: Self-pay

## 2018-06-10 ENCOUNTER — Encounter: Payer: Self-pay | Admitting: Emergency Medicine

## 2018-06-10 ENCOUNTER — Emergency Department
Admission: EM | Admit: 2018-06-10 | Discharge: 2018-06-10 | Disposition: A | Discharge status: Home / Self Care | Payer: Worker's Compensation | Attending: Emergency Medicine | Admitting: Emergency Medicine

## 2018-06-10 ENCOUNTER — Emergency Department: Payer: Worker's Compensation

## 2018-06-10 DIAGNOSIS — X500XXA Overexertion from strenuous movement or load, initial encounter: Secondary | ICD-10-CM | POA: Insufficient documentation

## 2018-06-10 DIAGNOSIS — Y990 Civilian activity done for income or pay: Secondary | ICD-10-CM | POA: Diagnosis not present

## 2018-06-10 DIAGNOSIS — S29019A Strain of muscle and tendon of unspecified wall of thorax, initial encounter: Secondary | ICD-10-CM

## 2018-06-10 DIAGNOSIS — Y929 Unspecified place or not applicable: Secondary | ICD-10-CM | POA: Diagnosis not present

## 2018-06-10 DIAGNOSIS — Y9389 Activity, other specified: Secondary | ICD-10-CM | POA: Insufficient documentation

## 2018-06-10 DIAGNOSIS — S39012A Strain of muscle, fascia and tendon of lower back, initial encounter: Secondary | ICD-10-CM | POA: Diagnosis not present

## 2018-06-10 DIAGNOSIS — S29012A Strain of muscle and tendon of back wall of thorax, initial encounter: Secondary | ICD-10-CM | POA: Diagnosis not present

## 2018-06-10 DIAGNOSIS — S299XXA Unspecified injury of thorax, initial encounter: Secondary | ICD-10-CM | POA: Diagnosis present

## 2018-06-10 MED ORDER — HYDROCODONE-ACETAMINOPHEN 7.5-325 MG PO TABS: 1.0000 | ORAL_TABLET | Freq: Once | ORAL | Status: DC

## 2018-06-10 MED ORDER — HYDROCODONE-ACETAMINOPHEN 5-325 MG PO TABS: 1.0000 | ORAL_TABLET | Freq: Four times a day (QID) | ORAL | 0 refills | Status: DC | PRN

## 2018-06-10 MED ORDER — METHOCARBAMOL 500 MG PO TABS: 500.0000 mg | ORAL_TABLET | Freq: Four times a day (QID) | ORAL | 0 refills | Status: DC | PRN

## 2018-06-10 MED ORDER — HYDROCODONE-ACETAMINOPHEN 5-325 MG PO TABS
ORAL_TABLET | ORAL | Status: AC
  Administered 2018-06-10: 1
  Filled 2018-06-10: qty 1

## 2018-06-10 MED ORDER — HYDROCODONE-ACETAMINOPHEN 5-325 MG PO TABS
ORAL_TABLET | ORAL | Status: AC
  Administered 2018-06-10: 1 via ORAL
  Filled 2018-06-10: qty 1

## 2018-06-10 MED ORDER — HYDROCODONE-ACETAMINOPHEN 5-325 MG PO TABS
1.0000 | ORAL_TABLET | Freq: Once | ORAL | Status: AC
  Administered 2018-06-10: 1 via ORAL

## 2018-06-10 MED ORDER — IBUPROFEN 600 MG PO TABS: 600.0000 mg | ORAL_TABLET | Freq: Three times a day (TID) | ORAL | 0 refills | Status: DC | PRN

## 2018-06-10 MED ORDER — ORPHENADRINE CITRATE 30 MG/ML IJ SOLN
60.0000 mg | Freq: Two times a day (BID) | INTRAMUSCULAR | Status: DC
  Administered 2018-06-10: 60 mg via INTRAMUSCULAR
  Filled 2018-06-10: qty 2

## 2018-06-10 NOTE — ED Notes (Signed)
See triage note  Presents with lower back pain  States he was trying to move a hog that was stuck in a trough on Friday  conts ot have lower back pain

## 2018-06-10 NOTE — ED Triage Notes (Addendum)
Says Friday leaned into sty to move a pig and he thinks he twisted back. Has pain left lower back that has not improved since.    Works at [employer] on [address]

## 2018-06-10 NOTE — Discharge Instructions (Signed)
[facility] if any continued problems.  Begin taking medication only as directed.  You may take ibuprofen while working.  When you are at home you may take methocarbamol 500 mg every 6 hours as needed for muscle spasms.  Norco is for pain and contains a narcotic.  This should only be taken when you are at home and not driving or operating machinery.  Use moist heat or ice to your muscles as needed for discomfort.  Return to the emergency department for any severe worsening of your symptoms.

## 2018-06-10 NOTE — ED Provider Notes (Signed)
[facility] Emergency Department Provider Note  ____________________________________________   First MD Initiated Contact with Patient 06/10/18 1439     (approximate)  I have reviewed the triage vital signs and the nursing notes.   HISTORY  Chief Complaint Back Pain  HPI [name] is a 52 y.o. male presents to the ED with complaint of back pain.  Patient states that he was trying to move a hog that was stuck and a trial of 3 days ago.  He states that he is continued to have low back pain since that time.  Patient denies any fall to the ground or direct trauma.  He states he twisted and now is unable to get comfortable.  He denies any incontinence of bowel or bladder, saddle anesthesias.  Patient continues to ambulate without any assistance.  Currently rates his pain as a 10/10.   History reviewed. No pertinent past medical history.  There are no active problems to display for this patient.   Past Surgical History:  Procedure Laterality Date  . LITHOTRIPSY      Prior to Admission medications   Medication Sig Start Date End Date Taking? Authorizing Provider  HYDROcodone-acetaminophen (NORCO/VICODIN) 5-325 MG tablet Take 1 tablet by mouth every 6 (six) hours as needed for moderate pain. 06/10/18   Tommi Rumps, PA-C  ibuprofen (ADVIL,MOTRIN) 600 MG tablet Take 1 tablet (600 mg total) by mouth every 8 (eight) hours as needed. 06/10/18   Tommi Rumps, PA-C  methocarbamol (ROBAXIN) 500 MG tablet Take 1 tablet (500 mg total) by mouth every 6 (six) hours as needed for muscle spasms. 06/10/18   Tommi Rumps, PA-C    Allergies Patient has no known allergies.  No family history on file.  Social History Social History   Tobacco Use  . Smoking status: Never Smoker  . Smokeless tobacco: Never Used  Substance Use Topics  . Alcohol use: Yes  . Drug use: No    Review of Systems Constitutional: No fever/chills Cardiovascular: Denies chest  pain. Respiratory: Denies shortness of breath. Musculoskeletal: Positive for back pain. Skin: Negative for rash. Neurological: Negative for  focal weakness or numbness. ___________________________________________   PHYSICAL EXAM:  VITAL SIGNS: ED Triage Vitals [06/10/18 1418]  Enc Vitals Group     BP (!) 155/92     Pulse Rate 76     Resp 16     Temp 99 F (37.2 C)     Temp Source Oral     SpO2 98 %     Weight 200 lb (90.7 kg)     Height 5\' 10"  (1.778 m)     Head Circumference      Peak Flow      Pain Score 7     Pain Loc      Pain Edu?      Excl. in GC?    Constitutional: Alert and oriented. Well appearing and in no acute distress. Eyes: Conjunctivae are normal.  Head: Atraumatic. Neck: No stridor.   Cardiovascular: Normal rate, regular rhythm. Grossly normal heart sounds.  Good peripheral circulation. Respiratory: Normal respiratory effort.  No retractions. Lungs CTAB. Gastrointestinal: Soft and nontender. No distention. Musculoskeletal: Examination of the back there is no gross deformity however there is marked tenderness on palpation of the thoracic and lumbar spine and paravertebral muscles bilaterally.  Range of motion is guarded and restricted secondary to active muscle spasms.  No abrasions, ecchymosis or erythema is present. Neurologic:  Normal speech and  language. No gross focal neurologic deficits are appreciated.  Skin:  Skin is warm, dry and intact.  Psychiatric: Mood and affect are normal. Speech and behavior are normal.  ____________________________________________   LABS (all labs ordered are listed, but only abnormal results are displayed)  Labs Reviewed - No data to display  RADIOLOGY  Official radiology report(s): Dg Thoracic Spine 2 View  Result Date: 06/10/2018 CLINICAL DATA:  Lower back pain EXAM: THORACIC SPINE 2 VIEWS COMPARISON:  CXR 05/28/2015 which includes the thoracic spine FINDINGS: There is no evidence of thoracic spine fracture.  Alignment is normal. Twelve paired ribbed thoracic vertebrae are noted. The inferior endplate of T12 is excluded on the frontal projection but well visualized on the lateral view and is unremarkable. Anterior osteophytes across the C5-6 interspace. No other significant bone abnormalities are identified. IMPRESSION: No acute nor suspicious osseous abnormality of the thoracic spine. Osteophytes at the C5-6 interspace anteriorly of the cervical spine. Electronically Signed   By: Tollie Ethavid  Kwon M.D.   On: 06/10/2018 16:53   Dg Lumbar Spine 2-3 Views  Result Date: 06/10/2018 CLINICAL DATA:  Presents with lower back pain States he was trying to move a hog that was stuck in a trough on Friday and continues to have lower back pain EXAM: LUMBAR SPINE - 2-3 VIEW COMPARISON:  CT of the abdomen and pelvis on 06/27/2017 FINDINGS: There is no evidence of lumbar spine fracture. Alignment is normal. Intervertebral disc spaces are maintained. IMPRESSION: Negative. Electronically Signed   By: Norva PavlovElizabeth  Brown M.D.   On: 06/10/2018 16:53   ____________________________________________   PROCEDURES  Procedure(s) performed: None  Procedures  Critical Care performed: No  ____________________________________________   INITIAL IMPRESSION / ASSESSMENT AND PLAN / ED COURSE  As part of my medical decision making, I reviewed the following data within the electronic MEDICAL RECORD NUMBER Notes from prior ED visits and [place] Controlled Substance Database  52 year old male presents to the ED with complaint of back pain for the last 3 days.  Patient injured his back while trying to get a 600 pound hog out of the trough.  He denies any direct trauma to his back but reports a lot of twisting to accomplish what he was trying to do.  Physical exam showed moderate tenderness on palpation of the thoracic and lumbar spine.  Patient was improving with Norflex 60 mg IM and Norco 10 mg p.o.  X-rays were reassuring and patient was made aware.  He  is to [facility] PCP [facility] if any continued problems.  He was discharged with prescription for Robaxin 500 mg every 6 hours as needed for muscle spasms and Norco as needed for pain.  He is encouraged to use ice or heat to his back as needed for discomfort.  ____________________________________________   FINAL CLINICAL IMPRESSION(S) / ED DIAGNOSES  Final diagnoses:  Thoracic myofascial strain, initial encounter  Strain of lumbar region, initial encounter     ED Discharge Orders         Ordered    methocarbamol (ROBAXIN) 500 MG tablet  Every 6 hours PRN     06/10/18 1704    ibuprofen (ADVIL,MOTRIN) 600 MG tablet  Every 8 hours PRN     06/10/18 1704    HYDROcodone-acetaminophen (NORCO/VICODIN) 5-325 MG tablet  Every 6 hours PRN     06/10/18 1704           Note:  This document was prepared using Dragon voice recognition software and may include  unintentional dictation errors.    Tommi Rumps, PA-C 06/10/18 1840    Jene Every, MD 06/11/18 [phone]

## 2018-07-18 ENCOUNTER — Emergency Department
Admission: EM | Admit: 2018-07-18 | Discharge: 2018-07-18 | Disposition: A | Discharge status: Home / Self Care | Payer: 59 | Attending: Emergency Medicine | Admitting: Emergency Medicine

## 2018-07-18 ENCOUNTER — Other Ambulatory Visit: Payer: Self-pay

## 2018-07-18 ENCOUNTER — Emergency Department: Payer: 59

## 2018-07-18 ENCOUNTER — Encounter: Payer: Self-pay | Admitting: Emergency Medicine

## 2018-07-18 DIAGNOSIS — R079 Chest pain, unspecified: Secondary | ICD-10-CM | POA: Diagnosis present

## 2018-07-18 DIAGNOSIS — R0789 Other chest pain: Secondary | ICD-10-CM | POA: Diagnosis not present

## 2018-07-18 LAB — CBC
HCT: 39.4 % (ref 39.0–52.0)
Hemoglobin: 13.4 g/dL (ref 13.0–17.0)
MCH: 32.1 pg (ref 26.0–34.0)
MCHC: 34 g/dL (ref 30.0–36.0)
MCV: 94.5 fL (ref 80.0–100.0)
PLATELETS: 189 10*3/uL (ref 150–400)
RBC: 4.17 MIL/uL — ABNORMAL LOW (ref 4.22–5.81)
RDW: 12.6 % (ref 11.5–15.5)
WBC: 5.7 10*3/uL (ref 4.0–10.5)
nRBC: 0 % (ref 0.0–0.2)

## 2018-07-18 LAB — BASIC METABOLIC PANEL
Anion gap: 8 (ref 5–15)
BUN: 13 mg/dL (ref 6–20)
CALCIUM: 9 mg/dL (ref 8.9–10.3)
CO2: 26 mmol/L (ref 22–32)
CREATININE: 1.18 mg/dL (ref 0.61–1.24)
Chloride: 105 mmol/L (ref 98–111)
GFR calc Af Amer: >60 mL/min (ref >60)
Glucose, Bld: 99 mg/dL (ref 70–99)
Potassium: 3.9 mmol/L (ref 3.5–5.1)
SODIUM: 139 mmol/L (ref 135–145)

## 2018-07-18 LAB — TROPONIN I: Troponin I: <0.03 ng/mL (ref <0.03)

## 2018-07-18 LAB — FIBRIN DERIVATIVES D-DIMER (ARMC ONLY): Fibrin derivatives D-dimer (ARMC): 302.29 ng/mL (FEU) (ref 0.00–499.00)

## 2018-07-18 MED ORDER — FAMOTIDINE 20 MG PO TABS: 20.0000 mg | ORAL_TABLET | Freq: Two times a day (BID) | ORAL | 0 refills | Status: DC

## 2018-07-18 MED ORDER — ASPIRIN 81 MG PO CHEW
162.0000 mg | CHEWABLE_TABLET | Freq: Once | ORAL | Status: AC
  Administered 2018-07-18: 162 mg via ORAL
  Filled 2018-07-18: qty 2

## 2018-07-18 NOTE — ED Triage Notes (Signed)
Pt here with c/o left sided cp that began this am and left arm pain, diaphoretic in triage, appears in NAD.

## 2018-07-18 NOTE — Discharge Instructions (Addendum)
Return to the ER for new, worsening, persistent severe chest pain, difficulty breathing, weakness or lightheadedness, or any other new or worsening symptoms that concern you.  We have provided a referral to a cardiologist that you can follow-up with.

## 2018-07-18 NOTE — ED Notes (Signed)
Pt c/o chest pain that started at 2am - pain is located in left side of chest and radiates into left arm - reports dizziness - reports nonproductive cough that started this am - denies N/V

## 2018-07-18 NOTE — ED Provider Notes (Signed)
[facility] Emergency Department Provider Note ____________________________________________   First MD Initiated Contact with Patient 07/18/18 1107     (approximate)  I have reviewed the triage vital signs and the nursing notes.   HISTORY  Chief Complaint Chest Pain    HPI [name] is a 52 y.o. male with PMH as noted below and no prior cardiac history who presents with chest pain, acute onset at 2 AM and awakening him from sleep, somewhat subsided since then, and described as sharp.  It is not exertional.  The patient also reports some pain in the left arm.  He reports mild shortness of breath, or a feeling like he cannot quite catch his breath, but denies lightheadedness, weakness, or nausea.  No prior history of this pain.   History reviewed. No pertinent past medical history.  There are no active problems to display for this patient.   Past Surgical History:  Procedure Laterality Date  . LITHOTRIPSY      Prior to Admission medications   Medication Sig Start Date End Date Taking? Authorizing Provider  famotidine (PEPCID) 20 MG tablet Take 1 tablet (20 mg total) by mouth 2 (two) times daily for 7 days. 07/18/18 07/25/18  Dionne Bucy, MD  HYDROcodone-acetaminophen (NORCO/VICODIN) 5-325 MG tablet Take 1 tablet by mouth every 6 (six) hours as needed for moderate pain. 06/10/18   Tommi Rumps, PA-C  ibuprofen (ADVIL,MOTRIN) 600 MG tablet Take 1 tablet (600 mg total) by mouth every 8 (eight) hours as needed. 06/10/18   Tommi Rumps, PA-C  methocarbamol (ROBAXIN) 500 MG tablet Take 1 tablet (500 mg total) by mouth every 6 (six) hours as needed for muscle spasms. 06/10/18   Tommi Rumps, PA-C    Allergies Patient has no known allergies.  No family history on file.  Social History Social History   Tobacco Use  . Smoking status: Never Smoker  . Smokeless tobacco: Never Used  Substance Use Topics  . Alcohol use: Yes  . Drug  use: No    Review of Systems  Constitutional: No fever. Eyes: No visual changes. ENT: No sore throat. Cardiovascular: Denies chest pain. Respiratory: Denies shortness of breath. Gastrointestinal: No vomiting. Genitourinary: Negative for dysuria.  Musculoskeletal: Negative for back pain. Skin: Negative for rash. Neurological: Negative for headache.   ____________________________________________   PHYSICAL EXAM:  VITAL SIGNS: ED Triage Vitals [07/18/18 1008]  Enc Vitals Group     BP      Pulse      Resp      Temp      Temp src      SpO2      Weight 200 lb (90.7 kg)     Height 5\' 10"  (1.778 m)     Head Circumference      Peak Flow      Pain Score 4     Pain Loc      Pain Edu?      Excl. in GC?     Constitutional: Alert and oriented. Well appearing and in no acute distress. Eyes: Conjunctivae are normal.  Head: Atraumatic. Nose: No congestion/rhinnorhea. Mouth/Throat: Mucous membranes are moist.   Neck: Normal range of motion.  Cardiovascular: Normal rate, regular rhythm. Grossly normal heart sounds.  Good peripheral circulation. Respiratory: Normal respiratory effort.  No retractions. Lungs CTAB. Gastrointestinal: No distention.  Musculoskeletal: No lower extremity edema.  Extremities warm and well perfused.  No calf or popliteal swelling or tenderness. Neurologic:  Normal  speech and language. No gross focal neurologic deficits are appreciated.  Skin:  Skin is warm and dry. No rash noted. Psychiatric: Mood and affect are normal. Speech and behavior are normal.  ____________________________________________   LABS (all labs ordered are listed, but only abnormal results are displayed)  Labs Reviewed  CBC - Abnormal; Notable for the following components:      Result Value   RBC 4.17 (*)    All other components within normal limits  BASIC METABOLIC PANEL  TROPONIN I  FIBRIN DERIVATIVES D-DIMER ([facility] ONLY)  TROPONIN I    ____________________________________________  EKG  ED ECG REPORT I, Dionne Bucy, the attending physician, personally viewed and interpreted this ECG.  Date: 07/18/2018 EKG Time: 937 Rate: 85 Rhythm: normal sinus rhythm QRS Axis: normal Intervals: normal ST/T Wave abnormalities: normal Narrative Interpretation: no evidence of acute ischemia  ____________________________________________  RADIOLOGY  CXR: No focal infiltrate or other acute abnormality  ____________________________________________   PROCEDURES  Procedure(s) performed: No  Procedures  Critical Care performed: No ____________________________________________   INITIAL IMPRESSION / ASSESSMENT AND PLAN / ED COURSE  Pertinent labs & imaging results that were available during my care of the patient were reviewed by me and considered in my medical decision making (see chart for details).  52 year old male with no prior cardiac history presents with atypical chest pain which awoke him from sleep last night.  It is now somewhat subsided although still present.  On exam, the patient is well-appearing.  His vital signs are normal.  His physical exam is unremarkable.  The EKG is nonischemic.  Overall given the patient's lack of risk factors, well appearance, and the negative initial work-up, I have a very low suspicion for ACS.  The presentation is most consistent with GERD, given the onset during the night, or musculoskeletal cause.  There is no clinical evidence to suspect aortic dissection or other vascular cause especially as the pain is subsiding on its own.  PE is also less likely, but given the sudden onset and the fact that I cannot rule the patient out by [facility], I will obtain a d-dimer.  We will also obtain troponins x2 and reassess.  I anticipate discharge home with cardiology referral if the work-up is negative.  ----------------------------------------- 2:20 PM on  07/18/2018 -----------------------------------------  The patient's chest pain has almost completely subsided.  He feels comfortable.  The repeat troponin and d-dimer are all negative.  The patient is stable for discharge at this time.  Return precautions given, and he expresses understanding.  We have also provided cardiology referral and prescription for famotidine. ____________________________________________   FINAL CLINICAL IMPRESSION(S) / ED DIAGNOSES  Final diagnoses:  Atypical chest pain      NEW MEDICATIONS STARTED DURING THIS VISIT:  New Prescriptions   FAMOTIDINE (PEPCID) 20 MG TABLET    Take 1 tablet (20 mg total) by mouth 2 (two) times daily for 7 days.     Note:  This document was prepared using Dragon voice recognition software and may include unintentional dictation errors.    Dionne Bucy, MD 07/18/18 1421

## 2018-09-06 IMAGING — CT CT ABD-PELV W/ CM
2 of 5 series · 15 of 46 positions shown, 17 images · IV contrast (APPLIED)
Comparison: CT of the chest, abdomen and pelvis performed
07/01/2013

CLINICAL DATA: Acute onset of vomiting and decreased appetite.
Diaphoresis. Initial encounter.

EXAM:
CT ABDOMEN AND PELVIS WITH CONTRAST
TECHNIQUE: Multidetector CT imaging of the abdomen and pelvis was performed
using the standard protocol following bolus administration of
intravenous contrast.
CONTRAST:  100mL EPGV9O-GEE IOPAMIDOL (EPGV9O-GEE) INJECTION 61%

[Series 2: routine abd/pel with · axial · 0.75mm/px · z∈[-977,-517]mm · 12 of 102 slices shown, 14 images]
[im 5/102  soft-tissue]
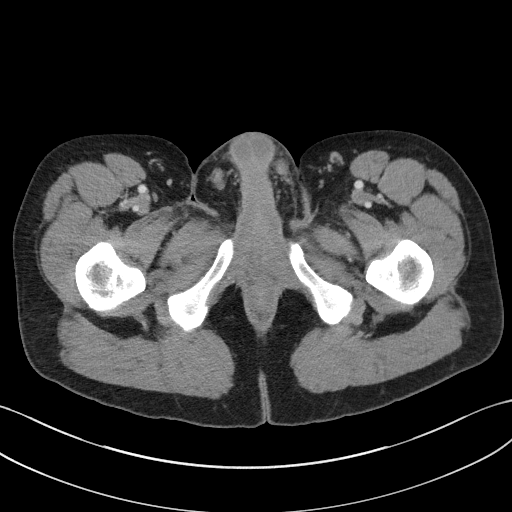
[im 5/102  bone]
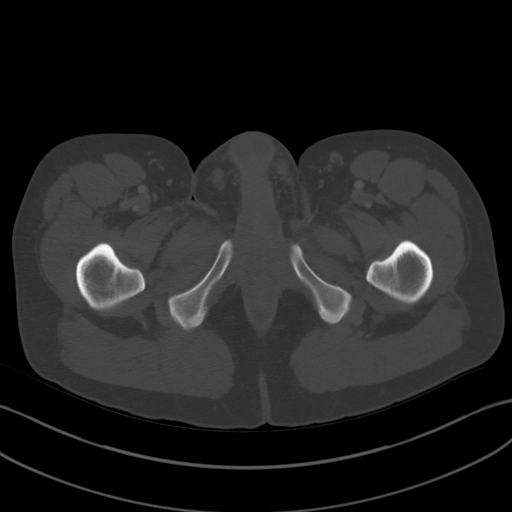
[im 15/102  soft-tissue]
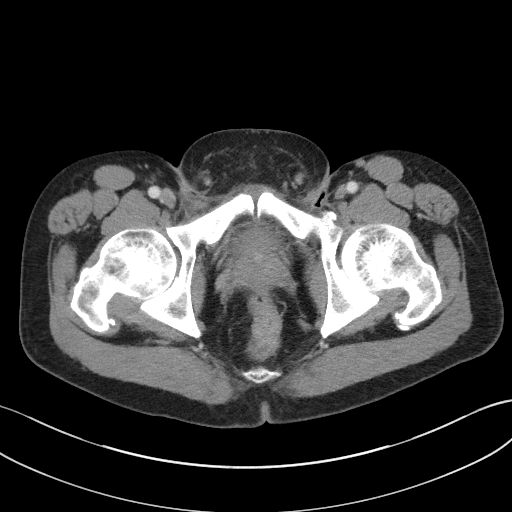
[im 25/102  soft-tissue]
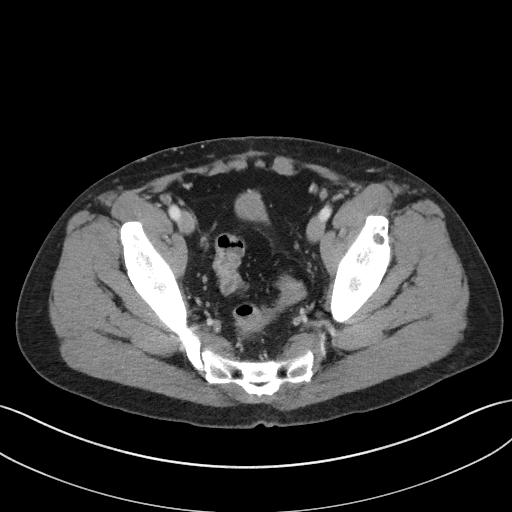
[im 29/102  soft-tissue]
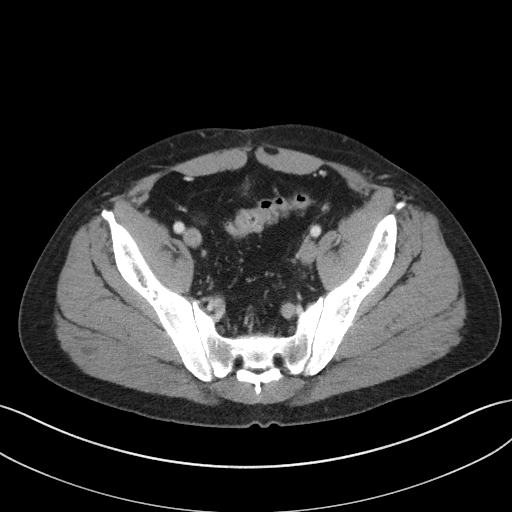
[im 39/102  soft-tissue]
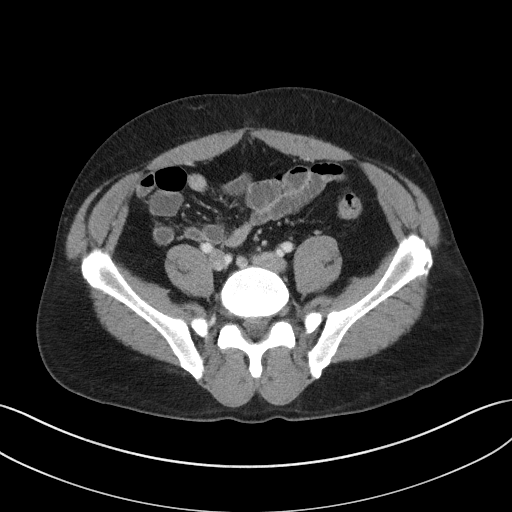
[im 49/102  soft-tissue]
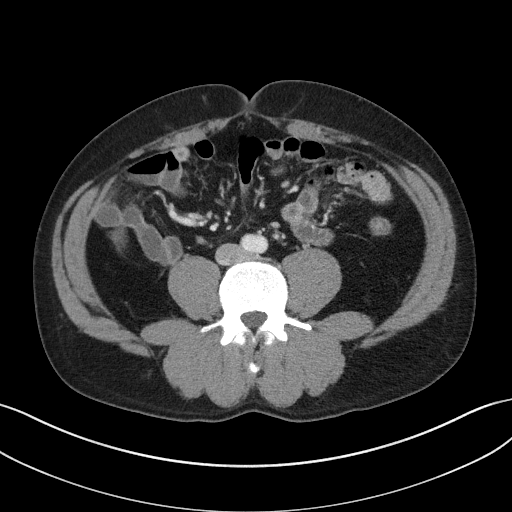
[im 53/102  soft-tissue]
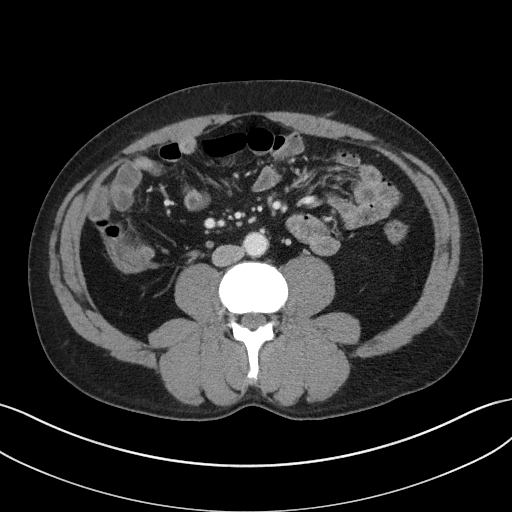
[im 63/102  soft-tissue]
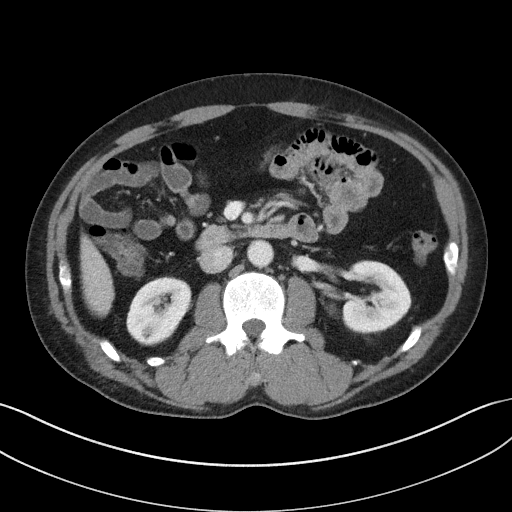
[im 73/102  soft-tissue]
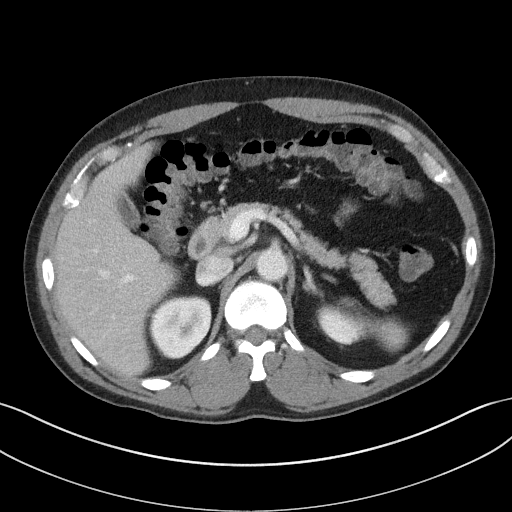
[im 73/102  bone]
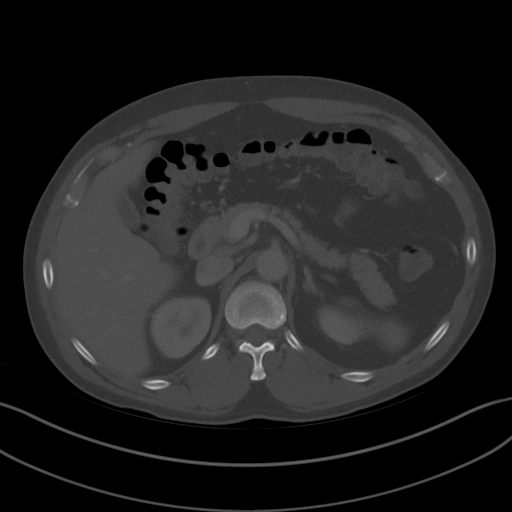
[im 77/102  soft-tissue]
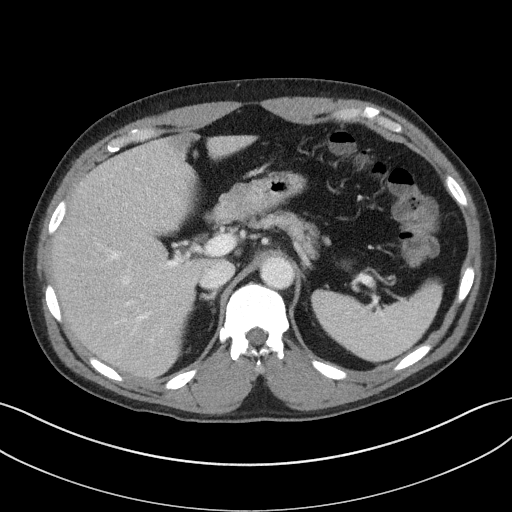
[im 87/102  soft-tissue]
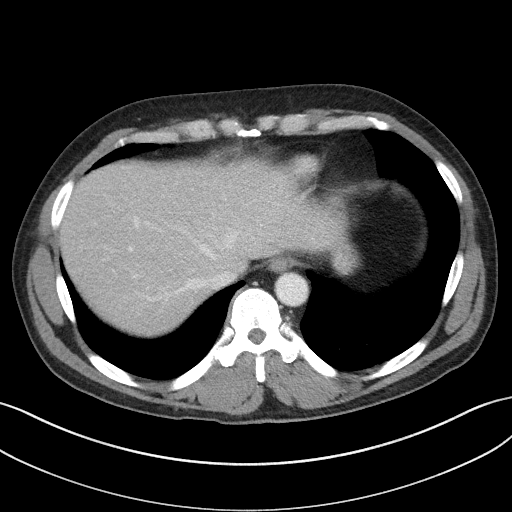
[im 97/102  soft-tissue]
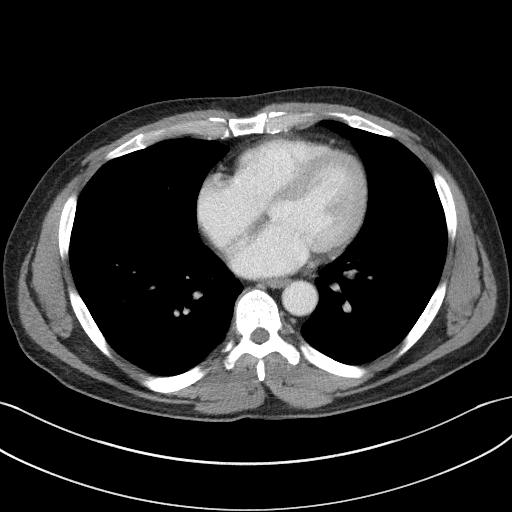

[Series 6: coronal st · coronal · 0.74mm/px · 3 of 83 slices shown]
[im 28/83  soft-tissue]
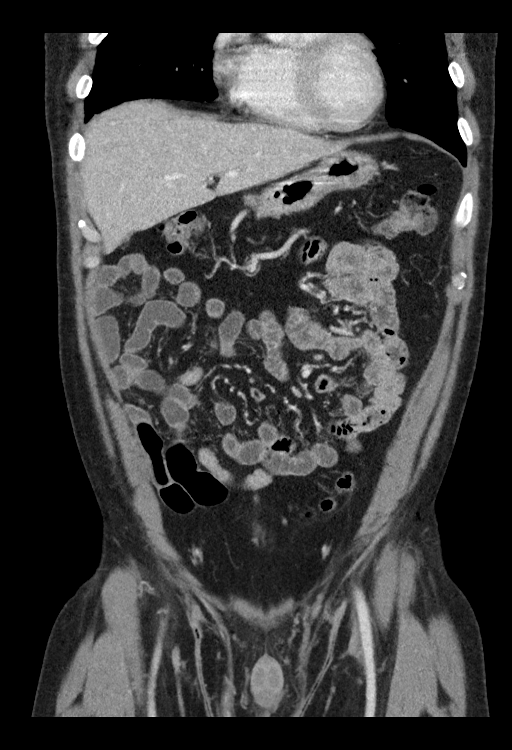
[im 37/83  soft-tissue]
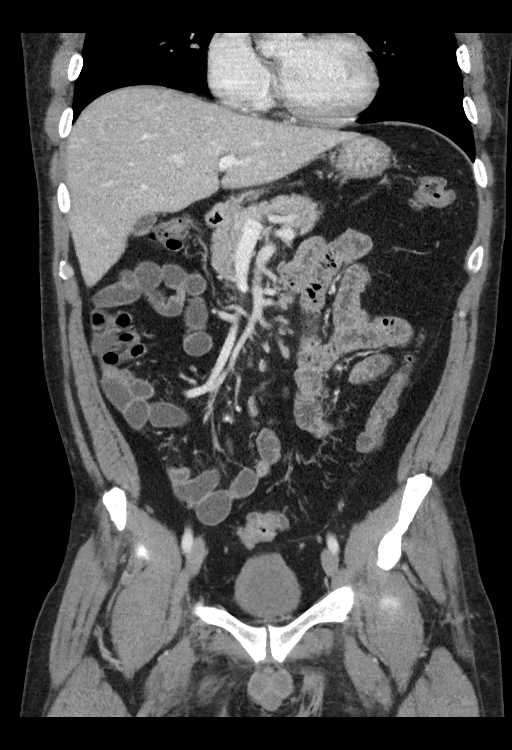
[im 46/83  soft-tissue]
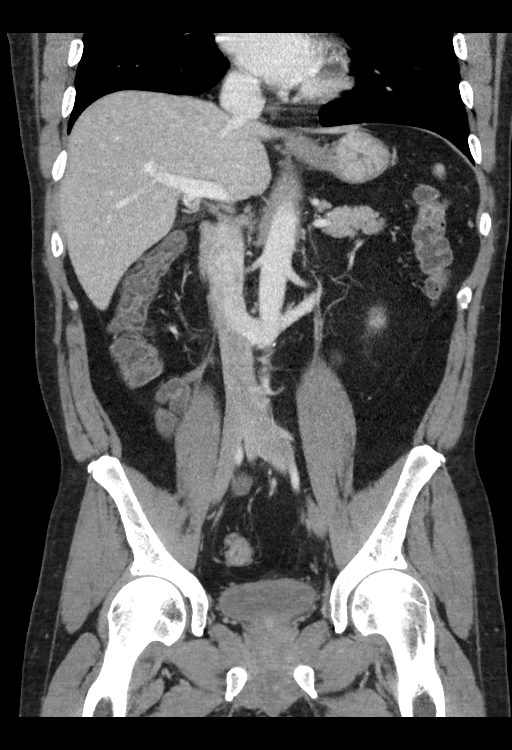

[15 of 46 positions shown; findings below may reference images not displayed]

FINDINGS: Lower chest: The visualized lung bases are grossly clear. The
visualized portions of the mediastinum are unremarkable.

Hepatobiliary: The liver is unremarkable in appearance. The
gallbladder is unremarkable in appearance. The common bile duct
remains normal in caliber.

Pancreas: The pancreas is within normal limits.

Spleen: The spleen is unremarkable in appearance.

Adrenals/Urinary Tract: The adrenal glands are unremarkable in
appearance. A small right renal cyst is noted. There is no evidence
of hydronephrosis. No renal or ureteral stones are identified. No
perinephric stranding is seen.

Stomach/Bowel: The stomach is unremarkable in appearance. The small
bowel is within normal limits. The appendix is normal in caliber,
without evidence of appendicitis. The colon is unremarkable in
appearance.

Vascular/Lymphatic: The abdominal aorta is unremarkable in
appearance. A retroaortic left renal vein is noted. The inferior
vena cava is grossly unremarkable. No retroperitoneal
lymphadenopathy is seen. No pelvic sidewall lymphadenopathy is
identified.

Reproductive: The bladder is mildly distended and grossly
unremarkable. The prostate remains normal in size.

Other: No additional soft tissue abnormalities are seen.

Musculoskeletal: No acute osseous abnormalities are identified. The
visualized musculature is unremarkable in appearance.
IMPRESSION: 1. No acute abnormality seen within the abdomen or pelvis.
2. Small right renal cyst.

## 2019-05-02 ENCOUNTER — Other Ambulatory Visit: Payer: Self-pay

## 2019-05-02 DIAGNOSIS — Z20822 Contact with and (suspected) exposure to covid-19: Secondary | ICD-10-CM

## 2019-05-04 LAB — NOVEL CORONAVIRUS, NAA: SARS-CoV-2, NAA: NOT DETECTED

## 2021-10-22 NOTE — ED Provider Notes (Signed)
 [facility] Emergency Department Provider Note   ED Clinical Impression   Final diagnoses:  Contact dermatitis of eyelid, unspecified laterality (Primary)  Allergic conjunctivitis of both eyes     Impression, Medical Decision Making, ED Course   Impression: 56 y.o. male who has no past medical history on file. who presents with itchy red and swollen eyes as described below.   Patient was recently seen at [facility] on 10/19/2021 at that time was diagnosed with allergic bilateral conjunctivitis and prescribed olopatadine drops as well as refresh or Systane drops and told to take over-the-counter antihistamines.  He says since then symptoms have improved slightly however he was told to come here for follow-up with ophthalmology.  During their exam fluorescein testing was negative for increased uptake or foreign body and bilateral IOP was within normal limits he was not having eye pain.  Upon my exam today patient has bilateral conjunctival injection, no pain with extraocular movements and mild allergic blepharitis and periorbital edema.  Pupils are equally round and reactive to light.  Patient states he has never seen an eye doctor and is not aware of having poor vision or needing glasses.  Visual acuity testing today is 20/70 R eye and > 20/100 L eye.  Verbally discussed patient with ophthalmology resident who at this time does not need to see the patient but recommends patient follow-up with ophthalmology in clinic and patient likely needs glasses outpatient.  At this time will provide a dose of cetirizine as well as discharge instructions and information to follow-up with ophthalmology.  At this time low suspicion for dermatitis or infectious blepharitis or cellulitis. Exam reasuring. Patient ready for discharge.  Diagnostic workup as below.  No orders of the defined types were placed in this encounter.       Considerations Regarding Disposition/Escalation of Care and  Critical Care: Discharge to outpatient follow up ____________________________________________  The case was discussed with the attending physician, who is in agreement with the above assessment and plan.    History   Chief Complaint Chief Complaint  Patient presents with  . Eye Problem  . Allergic Reaction    HPI  [name] is a 56 y.o. male with past medical history as below who presents with eye redness and itchiness and some eye swelling.  Patient was recently seen at [facility] on 10/19/2021 at that time was diagnosed with allergic bilateral conjunctivitis and prescribed olopatadine drops as well as refresh or Systane drops and told to take over-the-counter antihistamines.  He says since then symptoms have improved slightly however he was told to come here for follow-up with ophthalmology.  During their exam fluorescein testing was negative for increased uptake or foreign body and bilateral IOP was within normal limits he was not having eye pain.  Today patient reports some improvement in redness and itchiness and swelling however he was told to come to the main ED for follow-up.  He has no pain in his eyes and has no blurry vision when he opens his eyes fully and reports he can see normally.   External Records Reviewed: I have reviewed 10/19/2021 ED provider note from [facility] as above.  No past medical history on file.  No past surgical history on file.   Current Facility-Administered Medications:  .  cetirizine (ZyrTEC) tablet 10 mg, 10 mg, Oral, Once, Michael R Klemme, MD .  cetirizine (ZyrTEC) tablet 10 mg, 10 mg, Oral, Once, Michael R Klemme, MD  Current Outpatient Medications:  .  olopatadine (PATADAY) 0.2 % ophthalmic solution, Administer 1 drop to both eyes Two (2) times a day for 7 days., Disp: 0.7 mL, Rfl: 0  Allergies Shellfish containing products  Family History History reviewed. No pertinent family history.  Social  History Social History   Tobacco Use  . Smoking status: Never  . Smokeless tobacco: Current  Substance Use Topics  . Alcohol use: Yes  . Drug use: Not Currently     Physical Exam   VITAL SIGNS:    Vitals:   10/22/21 1123 10/22/21 1126 10/22/21 1129  BP:  156/85   Pulse: 107 96   Resp:  15   Temp:  36.7 C (98.1 F) 36.7 C (98.1 F)  TempSrc:   Oral  SpO2: 99% 100%   Weight:  95 kg (209 lb 7 oz)   Height:  182.9 cm (6')     Constitutional: Alert and oriented. No acute distress. Eyes: Conjunctivae are injected. PERRL, mild cataracts bilaterally. EOMI without pain. Mild periorbital/eyelid edema. Vis acuity: at 15 feet R eye 20/70, L eye 20/100 HEENT: Normocephalic and atraumatic.  Cardiovascular: Rate as above, regular rhythm. Respiratory: Normal respiratory effort. Gastrointestinal: Soft, non-distended, non-tender. Musculoskeletal: Non-tender with normal range of motion in all extremities. Neurologic: Normal speech and language. No gross focal neurologic deficits are appreciated. Patient is moving all extremities equally, face is symmetric at rest and with speech. Skin: Skin is warm, dry and intact. No rash noted. Psychiatric: Mood and affect are normal. Speech and behavior are normal.   Radiology   No orders to display    Pertinent labs & imaging results that were available during my care of the patient were independently interpreted by me and considered in my medical decision making (see chart for details).  Portions of this record have been created using Scientist, clinical (histocompatibility and immunogenetics). Dictation errors have been sought, but may not have been identified and corrected.     Ozell JONELLE Maine, MD Resident 10/22/21 [phone]

## 2022-10-02 NOTE — ED Provider Notes (Signed)
 [facility] Emergency Department Provider Note   ED Clinical Impression   Final diagnoses:  Chest pain, unspecified type (Primary)    Initial Impression, ED Course, Assessment and Plan   Time seen: October 02, 2022 8:34 AM  [name] is a 57 y.o. male with no significant PMH presenting with chest pain    Medical Decision Making Differential includes ACS although patient has no known risk factors. EKG is reassuring, will check 2 hour troponin given onset of pain at 6 AM. Pain sounds likely more MSK related given relation to movement and breathing. Will provide pain medication. PE is also possible although less likely given normal vital signs. Will still check d-dimer. Reviewed patient's chart, he had a similar presentation at [facility] that showed a negative work-up. He was not able to follow up with Cardiology as advised at that time.   Amount and/or Complexity of Data Reviewed External Data Reviewed: notes.    Details: [facility] ED visit for similar chest pain 07/18/2018 Labs: ordered. Decision-making details documented in ED Course. Radiology: ordered. Decision-making details documented in ED Course.    ED Course as of 10/10/22 1341  Mon Oct 02, 2022  1006 hsTroponin I (serial 0-2-6H w/ delta):   hsTroponin I <3     Social Determinants of Health with Concerns   Financial Resource Strain: Not on file  Internet Connectivity: Not on file  Food Insecurity: Not on file  Tobacco Use: High Risk (10/02/2022)   Patient History   . Smoking Tobacco Use: Never   . Smokeless Tobacco Use: Current   . Passive Exposure: Not on file  Housing/Utilities: Not on file  Alcohol Use: Not on file  Transportation Needs: Not on file  Substance Use: Not on file  Health Literacy: Not on file  Physical Activity: Not on file  Interpersonal Safety: Not on file  Stress: Not on file  Intimate Partner Violence: Not on file  Depression: Not on file  Social Connections:  Not on file    ____________________________________________    History   Chief Complaint Chest Pain   HPI  [name] is a 57 y.o. male with no significant PMH who presents to the [facility] Emergency Department for chest pain. Patient reports acute onset of left sided chest pain and pressure this morning upon waking up with associated shortness of breath. He notes his pain is worsened when taking a deep breath and with exertion. He has not taken anything for his symptoms this morning. No history of similar symptoms. No personal or family history of heart disease. No tobacco use. No recent long travel or surgeries. No history of PE/DVT. Patient denies nausea, vomiting, diaphoresis, or lightheadedness.    Past Medical History No past medical history on file.  Past Surgical History No past surgical history on file.  Medications  Current Facility-Administered Medications:  .  aspirin  chewable tablet 324 mg, 324 mg, Oral, Once, Billy Eva Eans, DO  Current Outpatient Medications:  .  famotidine  (PEPCID ) 20 MG tablet, Take 20 mg by mouth. (Patient not taking: Reported on 10/02/2022), Disp: , Rfl:  .  HYDROcodone -acetaminophen  (NORCO) 5-325 mg per tablet, Take 1 tablet by mouth. (Patient not taking: Reported on 10/02/2022), Disp: , Rfl:  .  ibuprofen  (MOTRIN ) 600 MG tablet, Take 600 mg by mouth. (Patient not taking: Reported on 10/02/2022), Disp: , Rfl:  .  methocarbamoL  (ROBAXIN ) 500 MG tablet, Take 500 mg by mouth. (Patient not taking: Reported on 10/02/2022), Disp: , Rfl:  Allergies Shellfish containing products  Family History History reviewed. No pertinent family history.  Social History Social History   Tobacco Use  . Smoking status: Never  . Smokeless tobacco: Current  Substance Use Topics  . Alcohol use: Yes  . Drug use: Not Currently    Review of Systems:  Pertinent positives and negatives are documented as per the HPI  Physical Exam   VITAL SIGNS:   ED  Triage Vitals [10/02/22 0825]  Enc Vitals Group     BP 154/90     Pulse 94     SpO2 Pulse      Resp 18     Temp 36.8 C (98.3 F)     Temp src Oral     SpO2 100 %     Weight 90.7 kg (200 lb)   Constitutional: Alert and oriented. Well appearing and in no distress. Eyes: Conjunctivae are normal.  ENT      Head: Normocephalic and atraumatic.      Nose: No congestion.      Mouth/Throat: Mucous membranes are moist.      Neck: No stridor. Cardiovascular: Normal rate, regular rhythm. Equal radial pulses. No lower extremity edema.  Respiratory: Normal respiratory effort. Gastrointestinal: Soft, non tender, non distend, without rebound or guarding. Musculoskeletal: No deformities. Neurologic: Normal speech and language. No gross focal neurologic deficits are appreciated. Skin: Skin is warm, dry and intact. No rash noted.   EKG    Radiology   XR Chest 2 views    (Results Pending)    Labs   Labs Reviewed  COMPREHENSIVE METABOLIC PANEL - Abnormal; Notable for the following components:      Result Value   Chloride 110 (*)    Total Bilirubin 0.2 (*)    All other components within normal limits  CBC W/ AUTO DIFF - Abnormal; Notable for the following components:   Absolute Lymphocytes 0.9 (*)    All other components within normal limits  HIGH SENSITIVITY TROPONIN I - SERIAL - Normal  D-DIMER, QUANTITATIVE - Normal   Narrative:    When used in conjunction with a clinical Pre-Test Probability assessment to exclude the venous thromboembolism (VTE) in outpatients suspected of deep vein thrombosis (DVT) and/or pulmonary embolism (PE), a cut-off of <500 ng/mL FEUs is recommended.   On 03-18-2020, [facility] converted D-Dimer testing from DDU (D-dimer Units) to FEUs (Fibrinogen Equivalence Units). Results reported prior to 0800 on 03-18-2020 were reported using DDUs which lowered the reported value in ng/mL by 2-3 fold.     HIGH SENSITIVITY TROPONIN I  - 2H/6H SERIAL - Normal  CBC W/ DIFFERENTIAL   Narrative:    The following orders were created for panel order CBC w/ Differential. Procedure                               Abnormality         Status                    ---------                               -----------         ------                    CBC w/ [id number]]  Abnormal            Final result               Please view results for these tests on the individual orders.  PROTIME-INR    Pertinent labs & imaging results that were available during my care of the patient were reviewed by me and considered in my medical decision making (see chart for details).   Documentation assistance was provided by Vernell Lean, Scribe, on October 02, 2022 at 8:35 AM for Eva Senters, DO.  I have reviewed the chart and agree with the documentation provided for me by the scribe. Justin G Myers, DO      Senters Eva Eans, [place] 10/10/22 1341

## 2022-10-02 NOTE — ED Triage Notes (Signed)
 Patient presents here with c/o left sided chest pain since this am associated with difficulty breathing. Denies any other associated symptoms. Reports walking making it worse. Denies any other health issues

## 2023-02-18 ENCOUNTER — Other Ambulatory Visit: Payer: Self-pay

## 2023-02-18 DIAGNOSIS — S61210A Laceration without foreign body of right index finger without damage to nail, initial encounter: Secondary | ICD-10-CM | POA: Insufficient documentation

## 2023-02-18 DIAGNOSIS — W25XXXA Contact with sharp glass, initial encounter: Secondary | ICD-10-CM | POA: Insufficient documentation

## 2023-02-18 DIAGNOSIS — Z23 Encounter for immunization: Secondary | ICD-10-CM | POA: Insufficient documentation

## 2023-02-18 DIAGNOSIS — S51811A Laceration without foreign body of right forearm, initial encounter: Secondary | ICD-10-CM | POA: Diagnosis present

## 2023-02-18 DIAGNOSIS — S61411A Laceration without foreign body of right hand, initial encounter: Secondary | ICD-10-CM | POA: Diagnosis not present

## 2023-02-18 NOTE — ED Triage Notes (Addendum)
Pt to ed from home for laceration to right palm and right wrist. Pt cut on piece of glass at home. Pt wounds bandaged in triage due to active bleeding. Pt is caox4, in no acute distress and ambulatory in triage. Pt has multiple lacerations and cuts all over his right hand.

## 2023-02-19 ENCOUNTER — Emergency Department: Payer: 59

## 2023-02-19 ENCOUNTER — Emergency Department
Admission: EM | Admit: 2023-02-19 | Discharge: 2023-02-19 | Disposition: A | Discharge status: Home / Self Care | Payer: 59 | Attending: Emergency Medicine | Admitting: Emergency Medicine

## 2023-02-19 ENCOUNTER — Encounter: Payer: Self-pay | Admitting: Radiology

## 2023-02-19 DIAGNOSIS — S61411A Laceration without foreign body of right hand, initial encounter: Secondary | ICD-10-CM

## 2023-02-19 MED ORDER — TETANUS-DIPHTH-ACELL PERTUSSIS 5-2.5-18.5 LF-MCG/0.5 IM SUSY
0.5000 mL | PREFILLED_SYRINGE | Freq: Once | INTRAMUSCULAR | Status: AC
  Administered 2023-02-19: 0.5 mL via INTRAMUSCULAR
  Filled 2023-02-19: qty 0.5
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
## 2023-02-19 NOTE — Discharge Instructions (Signed)
You have been seen in the emergency department for 3 lacerations to your right arm.  These have been repaired with sutures that will need to be removed in 7 to 10 days.  Please follow-up with your doctor urgent care or return to the emergency department if needed for suture removal.  Your tetanus has been updated today as well.  Return to the emergency department for any signs of infection such as fever pus/drainage or increased pain.  Please keep the area dry for the next 24 hours after which you may soak or bathe but allow it to air dry do not rub as this may disrupt her sutures.

## 2024-07-20 ENCOUNTER — Inpatient Hospital Stay
Admission: EM | Admit: 2024-07-20 | Discharge: 2024-07-24 | DRG: 433 | Disposition: A | Discharge status: Home / Self Care | Attending: Obstetrics and Gynecology | Admitting: Obstetrics and Gynecology

## 2024-07-20 ENCOUNTER — Emergency Department

## 2024-07-20 ENCOUNTER — Other Ambulatory Visit: Payer: Self-pay

## 2024-07-20 ENCOUNTER — Observation Stay

## 2024-07-20 DIAGNOSIS — K920 Hematemesis: Secondary | ICD-10-CM | POA: Diagnosis present

## 2024-07-20 DIAGNOSIS — Y92009 Unspecified place in unspecified non-institutional (private) residence as the place of occurrence of the external cause: Secondary | ICD-10-CM

## 2024-07-20 DIAGNOSIS — Z1152 Encounter for screening for COVID-19: Secondary | ICD-10-CM

## 2024-07-20 DIAGNOSIS — K7010 Alcoholic hepatitis without ascites: Secondary | ICD-10-CM | POA: Diagnosis not present

## 2024-07-20 DIAGNOSIS — Z91013 Allergy to seafood: Secondary | ICD-10-CM

## 2024-07-20 DIAGNOSIS — K760 Fatty (change of) liver, not elsewhere classified: Secondary | ICD-10-CM | POA: Diagnosis present

## 2024-07-20 DIAGNOSIS — I959 Hypotension, unspecified: Secondary | ICD-10-CM | POA: Diagnosis present

## 2024-07-20 DIAGNOSIS — R55 Syncope and collapse: Principal | ICD-10-CM | POA: Diagnosis present

## 2024-07-20 DIAGNOSIS — I5032 Chronic diastolic (congestive) heart failure: Secondary | ICD-10-CM | POA: Diagnosis present

## 2024-07-20 DIAGNOSIS — F1090 Alcohol use, unspecified, uncomplicated: Secondary | ICD-10-CM | POA: Insufficient documentation

## 2024-07-20 DIAGNOSIS — Z79899 Other long term (current) drug therapy: Secondary | ICD-10-CM

## 2024-07-20 DIAGNOSIS — E872 Acidosis, unspecified: Secondary | ICD-10-CM | POA: Diagnosis present

## 2024-07-20 DIAGNOSIS — Z7982 Long term (current) use of aspirin: Secondary | ICD-10-CM

## 2024-07-20 DIAGNOSIS — W1830XA Fall on same level, unspecified, initial encounter: Secondary | ICD-10-CM | POA: Diagnosis present

## 2024-07-20 DIAGNOSIS — S01112A Laceration without foreign body of left eyelid and periocular area, initial encounter: Secondary | ICD-10-CM | POA: Diagnosis present

## 2024-07-20 DIAGNOSIS — Z8673 Personal history of transient ischemic attack (TIA), and cerebral infarction without residual deficits: Secondary | ICD-10-CM

## 2024-07-20 DIAGNOSIS — F1010 Alcohol abuse, uncomplicated: Secondary | ICD-10-CM | POA: Diagnosis present

## 2024-07-20 LAB — LIPID PANEL
Cholesterol: 194 mg/dL (ref 0–200)
HDL: 99 mg/dL (ref >40)
LDL Cholesterol: 87 mg/dL (ref 0–99)
Total CHOL/HDL Ratio: 2 ratio
Triglycerides: 40 mg/dL (ref <150)
VLDL: 8 mg/dL (ref 0–40)

## 2024-07-20 LAB — URINE DRUG SCREEN, QUALITATIVE (ARMC ONLY)
Amphetamines, Ur Screen: NOT DETECTED
Barbiturates, Ur Screen: NOT DETECTED
Benzodiazepine, Ur Scrn: NOT DETECTED
Cannabinoid 50 Ng, Ur ~~LOC~~: NOT DETECTED
Cocaine Metabolite,Ur ~~LOC~~: NOT DETECTED
MDMA (Ecstasy)Ur Screen: NOT DETECTED
Methadone Scn, Ur: NOT DETECTED
Opiate, Ur Screen: NOT DETECTED
Phencyclidine (PCP) Ur S: NOT DETECTED
Tricyclic, Ur Screen: NOT DETECTED

## 2024-07-20 LAB — URINALYSIS, W/ REFLEX TO CULTURE (INFECTION SUSPECTED)
Bacteria, UA: NONE SEEN
Bilirubin Urine: NEGATIVE
Glucose, UA: NEGATIVE mg/dL
Hgb urine dipstick: NEGATIVE
Ketones, ur: NEGATIVE mg/dL
Leukocytes,Ua: NEGATIVE
Nitrite: NEGATIVE
Protein, ur: NEGATIVE mg/dL
Specific Gravity, Urine: 1.01 (ref 1.005–1.030)
pH: 5 (ref 5.0–8.0)

## 2024-07-20 LAB — LACTIC ACID, PLASMA
Lactic Acid, Venous: 3.2 mmol/L (ref 0.5–1.9)
Lactic Acid, Venous: 2.1 mmol/L (ref 0.5–1.9)
Lactic Acid, Venous: 1.4 mmol/L (ref 0.5–1.9)
Lactic Acid, Venous: 2.6 mmol/L (ref 0.5–1.9)

## 2024-07-20 LAB — COMPREHENSIVE METABOLIC PANEL WITH GFR
ALT: 177 U/L — ABNORMAL HIGH (ref 0–44)
AST: 712 U/L — ABNORMAL HIGH (ref 15–41)
Albumin: 3.5 g/dL (ref 3.5–5.0)
Alkaline Phosphatase: 75 U/L (ref 38–126)
Anion gap: 14 (ref 5–15)
BUN: 11 mg/dL (ref 6–20)
CO2: 22 mmol/L (ref 22–32)
Calcium: 8.7 mg/dL — ABNORMAL LOW (ref 8.9–10.3)
Chloride: 98 mmol/L (ref 98–111)
Creatinine, Ser: 1.05 mg/dL (ref 0.61–1.24)
GFR, Estimated: >60 mL/min (ref >60)
Glucose, Bld: 160 mg/dL — ABNORMAL HIGH (ref 70–99)
Potassium: 3.5 mmol/L (ref 3.5–5.1)
Sodium: 134 mmol/L — ABNORMAL LOW (ref 135–145)
Total Bilirubin: 1 mg/dL (ref 0.0–1.2)
Total Protein: 7.4 g/dL (ref 6.5–8.1)

## 2024-07-20 LAB — CBC
HCT: 39.2 % (ref 39.0–52.0)
Hemoglobin: 13.4 g/dL (ref 13.0–17.0)
MCH: 34 pg (ref 26.0–34.0)
MCHC: 34.2 g/dL (ref 30.0–36.0)
MCV: 99.5 fL (ref 80.0–100.0)
Platelets: 179 K/uL (ref 150–400)
RBC: 3.94 MIL/uL — ABNORMAL LOW (ref 4.22–5.81)
RDW: 14.5 % (ref 11.5–15.5)
WBC: 6.4 K/uL (ref 4.0–10.5)
nRBC: 0 % (ref 0.0–0.2)

## 2024-07-20 LAB — ETHANOL: Alcohol, Ethyl (B): <15 mg/dL (ref <15)

## 2024-07-20 LAB — TROPONIN I (HIGH SENSITIVITY)
Troponin I (High Sensitivity): 3 ng/L (ref <18)
Troponin I (High Sensitivity): 2 ng/L (ref <18)

## 2024-07-20 LAB — LIPASE, BLOOD: Lipase: 25 U/L (ref 11–51)

## 2024-07-20 LAB — CK: Total CK: 174 U/L (ref 49–397)

## 2024-07-20 LAB — CBG MONITORING, ED: Glucose-Capillary: 120 mg/dL — ABNORMAL HIGH (ref 70–99)

## 2024-07-20 MED ORDER — LORAZEPAM 1 MG PO TABS: 1.0000 mg | ORAL_TABLET | ORAL | Status: AC | PRN

## 2024-07-20 MED ORDER — LACTATED RINGERS IV SOLN: INTRAVENOUS | Status: AC

## 2024-07-20 MED ORDER — SODIUM CHLORIDE 0.9 % IV BOLUS
1000.0000 mL | Freq: Once | INTRAVENOUS | Status: AC
  Administered 2024-07-20: 1000 mL via INTRAVENOUS

## 2024-07-20 MED ORDER — FOLIC ACID 1 MG PO TABS
1.0000 mg | ORAL_TABLET | Freq: Every day | ORAL | Status: DC
  Administered 2024-07-20 – 2024-07-24 (×5): 1 mg via ORAL
  Filled 2024-07-20 (×5): qty 1

## 2024-07-20 MED ORDER — MUSCLE RUB 10-15 % EX CREA: 1.0000 | TOPICAL_CREAM | CUTANEOUS | Status: DC | PRN

## 2024-07-20 MED ORDER — ACETAMINOPHEN 325 MG PO TABS
650.0000 mg | ORAL_TABLET | ORAL | Status: DC | PRN
  Administered 2024-07-21 – 2024-07-22 (×3): 650 mg via ORAL
  Filled 2024-07-20 (×3): qty 2

## 2024-07-20 MED ORDER — SALINE SPRAY 0.65 % NA SOLN: 1.0000 | NASAL | Status: DC | PRN

## 2024-07-20 MED ORDER — ONDANSETRON HCL 4 MG/2ML IJ SOLN: 4.0000 mg | Freq: Four times a day (QID) | INTRAMUSCULAR | Status: DC | PRN

## 2024-07-20 MED ORDER — ENOXAPARIN SODIUM 40 MG/0.4ML IJ SOSY
40.0000 mg | PREFILLED_SYRINGE | INTRAMUSCULAR | Status: DC
  Filled 2024-07-20: qty 0.4

## 2024-07-20 MED ORDER — SODIUM CHLORIDE 0.9% FLUSH
3.0000 mL | Freq: Two times a day (BID) | INTRAVENOUS | Status: DC
  Administered 2024-07-20: 3 mL via INTRAVENOUS

## 2024-07-20 MED ORDER — ADULT MULTIVITAMIN W/MINERALS CH
1.0000 | ORAL_TABLET | Freq: Every day | ORAL | Status: DC
  Administered 2024-07-20 – 2024-07-24 (×5): 1 via ORAL
  Filled 2024-07-20 (×5): qty 1

## 2024-07-20 MED ORDER — THIAMINE HCL 100 MG/ML IJ SOLN: 100.0000 mg | Freq: Every day | INTRAMUSCULAR | Status: DC

## 2024-07-20 MED ORDER — ONDANSETRON HCL 4 MG PO TABS: 4.0000 mg | ORAL_TABLET | Freq: Four times a day (QID) | ORAL | Status: DC | PRN

## 2024-07-20 MED ORDER — THIAMINE MONONITRATE 100 MG PO TABS
100.0000 mg | ORAL_TABLET | Freq: Every day | ORAL | Status: DC
  Administered 2024-07-20 – 2024-07-24 (×5): 100 mg via ORAL
  Filled 2024-07-20 (×5): qty 1

## 2024-07-20 MED ORDER — LORAZEPAM 2 MG/ML IJ SOLN
1.0000 mg | INTRAMUSCULAR | Status: AC | PRN
  Administered 2024-07-21: 1 mg via INTRAVENOUS
  Filled 2024-07-20: qty 1

## 2024-07-20 NOTE — TOC Initial Note (Signed)
 Transition of Care [facility]) - Initial/Assessment Note    Patient Details  Name: [name] MRN: [id number] Date of Birth: 1965/10/01  [facility]) CM/SW Contact:    Shadell Brenn L Marcelo Ickes, LCSW Phone Number: 07/20/2024, 5:45 PM  Clinical Narrative:                  [facility] consult received for substance abuse counseling/education. TOC does not provide counseling/education. Resources have been uploaded to the AVS.        Patient Goals and CMS Choice            Expected Discharge Plan and Services                                              Prior Living Arrangements/Services                       Activities of Daily Living      Permission Sought/Granted                  Emotional Assessment              Admission diagnosis:  Acute alcoholic hepatitis (HCC) [K70.10] Patient Active Problem List   Diagnosis Date Noted   Acute alcoholic hepatitis (HCC) 07/20/2024   PCP:  Patient, No Pcp Per Pharmacy:   [facility] [address]), [place] - [address]) [place] [address] Phone: [phone] Fax: [phone]  [id number] [place] [place], [place] - [address] AT [facility] OF [address] [place] [address] Phone: [phone] Fax: [phone]     Social Drivers of Health (SDOH) Social History: SDOH Screenings   Tobacco Use: Low Risk  (07/20/2024)   SDOH Interventions:     Readmission Risk Interventions     No data to display

## 2024-07-20 NOTE — H&P (Addendum)
 History and Physical    Patient: [name] FMW:[id number] DOB: [birth year] DOA: 07/20/2024 DOS: the patient was seen and examined on 07/20/2024 PCP: Patient, No Pcp Per  Patient coming from: Home  Chief Complaint:  Chief Complaint  Patient presents with   Dizziness   HPI: [name] is a 58 y.o. male with alcohol abuse, prior lacunar infarct, who presents after acute syncopal episode at home.  Patient reports he was sitting outside to enjoy the sun line when he became suddenly diaphoretic and lost consciousness.  He reports he was near family members at that time.  He reports when he came through the family members were very stressed and trying to get him to the hospital.  He recalls being agitated at that time.  No family members present at the time of my evaluation but family members reported to EDP that he was unresponsive for 10 seconds before becoming more alert.  They report he was acutely agitated when he became alert. With EMS patient had another episode of LOC which lasted approximately 5 seconds.  In ED initial BP 68/48 which quickly resolved.  Labs reveal very elevated AST 712, ALT 177, sodium 134.  Labs otherwise unremarkable.  UA and UDS WNL.  CXR without acute cardiopulmonary disease.  Head CT with small chronic left thalamic lacunar infarct.  At the time of my evaluation patient has already returned to his mental status baseline.  He denies any acute complaints.  He does have small laceration over his left eye. Patient reports he had 1 similar episode many years prior and no etiology was ever found.   Review of Systems: As mentioned in the history of present illness. All other systems reviewed and are negative. History reviewed. No pertinent past medical history. Past Surgical History:  Procedure Laterality Date   LITHOTRIPSY     Social History:  reports that he has never smoked. He has never used smokeless tobacco. He reports current alcohol use. He reports that he  does not use drugs.  No Known Allergies  History reviewed. No pertinent family history.  Prior to Admission medications   Medication Sig Start Date End Date Taking? Authorizing Provider  famotidine  (PEPCID ) 20 MG tablet Take 1 tablet (20 mg total) by mouth 2 (two) times daily for 7 days. 07/18/18 07/25/18  Siadecki, Sebastian, MD  HYDROcodone -acetaminophen  (NORCO/VICODIN) 5-325 MG tablet Take 1 tablet by mouth every 6 (six) hours as needed for moderate pain. 06/10/18   Saunders Shona CROME, PA-C  ibuprofen  (ADVIL ,MOTRIN ) 600 MG tablet Take 1 tablet (600 mg total) by mouth every 8 (eight) hours as needed. 06/10/18   Saunders Shona CROME, PA-C  methocarbamol  (ROBAXIN ) 500 MG tablet Take 1 tablet (500 mg total) by mouth every 6 (six) hours as needed for muscle spasms. 06/10/18   Saunders Shona CROME, PA-C    Physical Exam: Vitals:   07/20/24 1352 07/20/24 1430 07/20/24 1600  BP: (!) 68/48 129/73 (!) 146/84  Pulse: 85 64 73  Resp: 19 19   Temp: 98 F (36.7 C)    SpO2: 100% 99% 98%   Constitutional:  Normal appearance. Non toxic-appearing.  HENT: Some erythema bilateral cheeks, small superficial laceration left eyelid. Cardiovascular: Rate and Rhythm: Normal rate and regular rhythm.  Pulmonary: Non labored, symmetric rise of chest wall.  Musculoskeletal:  Normal range of motion.  Skin: warm and dry. not jaundiced.  Neurological: No focal deficit present. alert. Oriented. Psychiatric: Mood and Affect congruent.   Data Reviewed: Lab Results  Component Value Date   WBC 6.4 07/20/2024   HGB 13.4 07/20/2024   HCT 39.2 07/20/2024   MCV 99.5 07/20/2024   PLT 179 07/20/2024      Latest Ref Rng & Units 07/20/2024    1:46 PM 07/18/2018    9:50 AM 06/27/2017   12:45 AM  CMP  Glucose 70 - 99 mg/dL 839  99  838   BUN 6 - 20 mg/dL 11  13  11    Creatinine 0.61 - 1.24 mg/dL 8.94  8.81  8.62   Sodium 135 - 145 mmol/L 134  139  137   Potassium 3.5 - 5.1 mmol/L 3.5  3.9  3.8   Chloride 98 - 111 mmol/L  98  105  104   CO2 22 - 32 mmol/L 22  26  23    Calcium 8.9 - 10.3 mg/dL 8.7  9.0  9.3   Total Protein 6.5 - 8.1 g/dL 7.4   8.4   Total Bilirubin 0.0 - 1.2 mg/dL 1.0   0.8   Alkaline Phos 38 - 126 U/L 75   61   AST 15 - 41 U/L 712   35   ALT 0 - 44 U/L 177   29     DG Chest Portable 1 View EXAM: 1 VIEW(S) XRAY OF THE CHEST 07/20/2024 02:44:00 PM  COMPARISON: 07/18/2018  CLINICAL HISTORY: dyspnea dyspnea  FINDINGS:  LUNGS AND PLEURA: No focal pulmonary opacity. No pulmonary edema. No pleural effusion. No pneumothorax.  HEART AND MEDIASTINUM: No acute abnormality of the cardiac and mediastinal silhouettes.  BONES AND SOFT TISSUES: No acute osseous abnormality.  IMPRESSION: 1. No acute cardiopulmonary disease.  Electronically signed by: Lynwood Seip MD 07/20/2024 02:47 PM EST RP Workstation: [id number] CT Head Wo Contrast EXAM: CT HEAD WITHOUT CONTRAST 07/20/2024 02:17:49 PM  TECHNIQUE: CT of the head was performed without the administration of intravenous contrast. Automated exposure control, iterative reconstruction, and/or weight based adjustment of the mA/kV was utilized to reduce the radiation dose to as low as reasonably achievable.  COMPARISON: Head CT 07/01/2013.  CLINICAL HISTORY: 58 year old male. Dizzy.  FINDINGS:  BRAIN AND VENTRICLES: Chronic lacunar infarct left thalamus is unchanged. No acute hemorrhage. No evidence of acute infarct. Gray-white differentiation elsewhere remains normal. Background brain volume remains normal. Faint vascular calcifications bilateral basal ganglia. Calcified atherosclerosis at the skull base. No suspicious intracranial vascular hyperdensity. No hydrocephalus. No extra-axial collection. No mass effect or midline shift.  ORBITS: No acute abnormality.  SINUSES: Visible paranasal sinuses, middle ears and mastoids remain well aerated.  SOFT TISSUES AND SKULL: No acute soft tissue abnormality. No skull  fracture.  IMPRESSION: 1. No acute intracranial abnormality. 2. Small chronic left thalamic lacunar infarct.  Electronically signed by: Helayne Hurst MD 07/20/2024 02:23 PM EST RP Workstation: [id number]    Assessment and Plan: Syncope - With associated diaphoresis, loss of consciousness, agitation immediately following the event - DDx is broad.  Cannot rule out seizure event, though CK unremarkable. - EEG ordered - Seizure precautions - Lactic acidosis already resolving -- Head CT, chronic lacunar infarct left thalamus no acute infarct. - High-sensitivity troponin 2 - UDS and UA unremarkable.  No obvious infectious etiology - Orthostatic vitals WNL. - EKG on arrival NSR - Monitor for arrhythmia.  Telemetry ordered. ECHO ordered -- Fall precautions  Elevated LFTs - AST 717, ALT 177.  Bili WNL - Patient denies any nausea, vomiting, abdominal pain. - Acute viral hepatitis panel ordered and pending.  This may  be acute alcoholic hepatitis - Right upper quadrant ultrasound pending - Given patient's profound hypotension on arrival there may be some component of shock liver which I expect to get worse before gets better - Repeat CMP in a.m. - Currently receiving IV fluids 125cc/hr, will continue with that for now.  Left eyelid laceration - Small and superficial.  Would not be amendable to suture or glue - Keep area clean.  Apply Vaseline  Does not have a primary care physician - Consult COC to assist with outpatient resource planning.  Alcohol abuse - Initiate CIWA protocol - Patient endorses two 40 ounce beers per day - EtOH level ordered - Folic acid, multivitamin, thiamine replacement ordered.  Hypotension - BP on arrival 68/48.  Spontaneously resolved.  Already improving.  Continue IV fluids - Monitor closely.  Does not take medications at home  Hx of CVA - Head CT: chronic lacunar infarct left thalamus no acute infarct. - Will obtain lipids and hemoglobin A1c for  risk stratification.  Likely should be on statin and aspirin  at minimum.  Does not see PCP   Advance Care Planning:   Code Status: Full Code   Consults:   Family Communication: Discussed directly with patient   Severity of Illness: The appropriate patient status for this patient is OBSERVATION. Observation status is judged to be reasonable and necessary in order to provide the required intensity of service to ensure the patient's safety. The patient's presenting symptoms, physical exam findings, and initial radiographic and laboratory data in the context of their medical condition is felt to place them at decreased risk for further clinical deterioration. Furthermore, it is anticipated that the patient will be medically stable for discharge from the hospital within 2 midnights of admission.   Author: Sacha Topor, DO 07/20/2024 5:02 PM  For on call review [email or web address].

## 2024-07-20 NOTE — ED Provider Notes (Signed)
 Consulted with hospitalist, patient accepted to hospital service for further workup as the cause and nature of his event.  Discussed case accepted by Dr. Leesa Dicky Anes, MD 07/20/24 [phone]

## 2024-07-20 NOTE — ED Notes (Signed)
 Bed alarm on, fall risk bracelet on, grip socks on, and call bell near pt

## 2024-07-20 NOTE — Discharge Instructions (Signed)

## 2024-07-20 NOTE — ED Notes (Signed)
 First nurse note: [facility] from home for unresponsive for 10 seconds per family. EMS arrived, pt was agitated and lethargic. Pt has strong ETOH smell per EMS, but pt denied ETOH. Pt lost consciousness for 5 seconds with EMS. No hx neurological problems.  113/65 first BP, then 85/65. BP remained systolic 90s en route. Fluids are running CBG 136, EKG NSR, HR 88, afebrile.   Pt complaining of dizziness and blurred vision.

## 2024-07-20 NOTE — ED Notes (Signed)
 Pt taken to triage room and pt states he feels like he is going to pass out. Pt became diaphoretic and sliding out of chair. BP taken twice and in the 60s. Pt taken to ED 16 and MD called to bedside with RN Bernice. Pt placed on  monitor and EKG performed.

## 2024-07-20 NOTE — ED Provider Notes (Signed)
 [facility] Provider Note    Event Date/Time   First MD Initiated Contact with Patient 07/20/24 1403     (approximate)   History   Dizziness   HPI  [name] is a 58 y.o. male no significant past medical history presents to the emergency department with an episode of unresponsiveness, presents with dizziness and lightheadedness.  Patient states he was in his normal state of health this morning, ate breakfast and was not having any issues then had a sudden onset of not feeling well with lightheadedness and broke out to a sweat.  States that he felt very dizzy and like he was going to pass out.  States that he drinks 2 or 3 beers on a daily basis.  Denies any history of alcohol withdrawal.  Does not follow regularly with primary care physicians.  Denies any significant drug use.  Denies any significant headache.  Denies fever, chills, burning with urination.  Denies any abdominal pain.  Did have some nausea and was not feeling well with diaphoresis prior to arrival but denies any chest pain or chest discomfort.  EMS was called by family members for an episode of unresponsiveness.  States that he was unresponsive for approximately 10 seconds.  When patient arrived he was agitated and lethargic.  Patient did hit his head.    With EMS had another episode of loss of consciousness for approximately 5 seconds.  His initial blood pressure with EMS was 113/65, repeat was 85/65, started IV fluids and remained to 90 systolic and route with EMS.  Glucose was 136 and his EKG had showed normal sinus rhythm.  When patient checked into triage his blood pressure was significantly low and he appeared unwell and altered with a blood pressure of 60 systolic.      Physical Exam   Triage Vital Signs: ED Triage Vitals  Encounter Vitals Group     BP 07/20/24 1352 (!) 68/48     Girls Systolic BP Percentile --      Girls Diastolic BP Percentile --      Boys Systolic BP Percentile  --      Boys Diastolic BP Percentile --      Pulse Rate 07/20/24 1352 85     Resp 07/20/24 1352 19     Temp 07/20/24 1352 98 F (36.7 C)     Temp src --      SpO2 07/20/24 1352 100 %     Weight --      Height --      Head Circumference --      [facility] Flow --      Pain Score 07/20/24 1401 0     Pain Loc --      Pain Education --      Exclude from Growth Chart --     Most recent vital signs: Vitals:   07/20/24 1352 07/20/24 1430  BP: (!) 68/48 129/73  Pulse: 85 64  Resp: 19 19  Temp: 98 F (36.7 C)   SpO2: 100% 99%    Physical Exam Constitutional:      Appearance: He is well-developed. He is ill-appearing.     Comments: Rolled back in a wheelchair, patient was diaphoretic, slumped over, ill-appearing, able to answer questions  HENT:     Head: Atraumatic.  Eyes:     Conjunctiva/sclera: Conjunctivae normal.  Cardiovascular:     Rate and Rhythm: Regular rhythm.  Pulmonary:     Effort: No respiratory distress.  Musculoskeletal:     Cervical back: Normal range of motion.  Skin:    General: Skin is warm.  Neurological:     Mental Status: Mental status is at baseline.     IMPRESSION / MDM / ASSESSMENT AND PLAN / ED COURSE  I reviewed the triage vital signs and the nursing notes.  Differential diagnosis including dysrhythmia, syncope, electrolyte abnormality, seizure, intracranial hemorrhage infectious process, ACS  Initial blood pressure 60 systolic.  EKG  I, Clotilda Punter, the attending physician, personally viewed and interpreted this ECG.  EKG with normal sinus rhythm.  Normal intervals.  No chamber lodgment.  No significant ST elevation or depression.  No findings of acute ischemia or dysrhythmia.  Repeat EKG with no acute changes.  No tachycardic or bradycardic dysrhythmias while on cardiac telemetry.  RADIOLOGY I independently reviewed imaging, my interpretation of imaging: CT scan of the head with no signs of intracranial hemorrhage or infarction.  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  Chest x-ray with no signs of pneumonia  LABS (all labs ordered are listed, but only abnormal results are displayed) Labs interpreted as -    Labs Reviewed  COMPREHENSIVE METABOLIC PANEL WITH GFR - Abnormal; Notable for the following components:      Result Value   Sodium 134 (*)    Glucose, Bld 160 (*)    Calcium 8.7 (*)    AST 712 (*)    ALT 177 (*)    All other components within normal limits  CBC - Abnormal; Notable for the following components:   RBC 3.94 (*)    All other components within normal limits  LACTIC ACID, PLASMA - Abnormal; Notable for the following components:   Lactic Acid, Venous 3.2 (*)    All other components within normal limits  CBG MONITORING, ED - Abnormal; Notable for the following components:   Glucose-Capillary 120 (*)    All other components within normal limits  LIPASE, BLOOD  LACTIC ACID, PLASMA  URINALYSIS, W/ REFLEX TO CULTURE (INFECTION SUSPECTED)  ETHANOL  URINE DRUG SCREEN, QUALITATIVE ([facility] ONLY)  TROPONIN I (HIGH SENSITIVITY)  TROPONIN I (HIGH SENSITIVITY)     MDM  Patient initially had a significantly low blood pressure.  Given 1 L of IV fluids and reevaluated.  Repeat EKG without acute changes.  Initial troponin was negative.  No significant leukocytosis.  Elevated lactic acid.  Does have elevated of LFTs.  Patient with high risk syncope -multiple episodes of dysrhythmia.  Have a low suspicion for sepsis.  Repeat abdominal exam with no abdominal tenderness to palpation.  No rebound or guarding.  Consulted hospitalist for high risk syncope     PROCEDURES:  Critical Care performed: yes  .Critical Care  Performed by: Punter Clotilda, MD Authorized by: Punter Clotilda, MD   Critical care provider statement:    Critical care time (minutes):  35   Critical care time was exclusive of:  Separately billable procedures and treating other patients   Critical care was necessary to treat or prevent imminent  or life-threatening deterioration of the following conditions:  Cardiac failure   Critical care was time spent personally by me on the following activities:  Development of treatment plan with patient or surrogate, discussions with consultants, evaluation of patient's response to treatment, examination of patient, ordering and review of laboratory studies, ordering and review of radiographic studies, ordering and performing treatments and interventions, pulse oximetry, re-evaluation of patient's condition and review of old charts   Care discussed with: admitting provider  Patient's presentation is most consistent with acute presentation with potential threat to life or bodily function.   MEDICATIONS ORDERED IN ED: Medications  sodium chloride  0.9 % bolus 1,000 mL (1,000 mLs Intravenous New Bag/Given 07/20/24 1435)    FINAL CLINICAL IMPRESSION(S) / ED DIAGNOSES   Final diagnoses:  Syncope, unspecified syncope type     Rx / DC Orders   ED Discharge Orders     None        Note:  This document was prepared using Dragon voice recognition software and may include unintentional dictation errors.   Suzanne Kirsch, MD 07/20/24 1549

## 2024-07-21 ENCOUNTER — Observation Stay
Admit: 2024-07-21 | Discharge: 2024-07-21 | Disposition: A | Discharge status: Home / Self Care | Attending: Family Medicine | Admitting: Family Medicine

## 2024-07-21 ENCOUNTER — Observation Stay

## 2024-07-21 DIAGNOSIS — K7010 Alcoholic hepatitis without ascites: Secondary | ICD-10-CM | POA: Diagnosis not present

## 2024-07-21 DIAGNOSIS — Z79899 Other long term (current) drug therapy: Secondary | ICD-10-CM | POA: Diagnosis not present

## 2024-07-21 DIAGNOSIS — R55 Syncope and collapse: Secondary | ICD-10-CM

## 2024-07-21 DIAGNOSIS — Z8673 Personal history of transient ischemic attack (TIA), and cerebral infarction without residual deficits: Secondary | ICD-10-CM | POA: Diagnosis not present

## 2024-07-21 DIAGNOSIS — W1830XA Fall on same level, unspecified, initial encounter: Secondary | ICD-10-CM | POA: Diagnosis present

## 2024-07-21 DIAGNOSIS — I5032 Chronic diastolic (congestive) heart failure: Secondary | ICD-10-CM | POA: Diagnosis present

## 2024-07-21 DIAGNOSIS — E872 Acidosis, unspecified: Secondary | ICD-10-CM | POA: Diagnosis present

## 2024-07-21 DIAGNOSIS — Y92009 Unspecified place in unspecified non-institutional (private) residence as the place of occurrence of the external cause: Secondary | ICD-10-CM | POA: Diagnosis not present

## 2024-07-21 DIAGNOSIS — Z1152 Encounter for screening for COVID-19: Secondary | ICD-10-CM | POA: Diagnosis not present

## 2024-07-21 DIAGNOSIS — R569 Unspecified convulsions: Secondary | ICD-10-CM | POA: Diagnosis not present

## 2024-07-21 DIAGNOSIS — F1010 Alcohol abuse, uncomplicated: Secondary | ICD-10-CM | POA: Diagnosis present

## 2024-07-21 DIAGNOSIS — Z7982 Long term (current) use of aspirin: Secondary | ICD-10-CM | POA: Diagnosis not present

## 2024-07-21 DIAGNOSIS — K760 Fatty (change of) liver, not elsewhere classified: Secondary | ICD-10-CM | POA: Diagnosis present

## 2024-07-21 DIAGNOSIS — S01112A Laceration without foreign body of left eyelid and periocular area, initial encounter: Secondary | ICD-10-CM | POA: Diagnosis present

## 2024-07-21 DIAGNOSIS — K920 Hematemesis: Secondary | ICD-10-CM | POA: Diagnosis present

## 2024-07-21 DIAGNOSIS — I959 Hypotension, unspecified: Secondary | ICD-10-CM | POA: Diagnosis present

## 2024-07-21 DIAGNOSIS — Z91013 Allergy to seafood: Secondary | ICD-10-CM | POA: Diagnosis not present

## 2024-07-21 LAB — ECHOCARDIOGRAM COMPLETE
AR max vel: 3.45 cm2
AV Area VTI: 3.44 cm2
AV Area mean vel: 3.4 cm2
AV Mean grad: 3 mmHg
AV Peak grad: 5.7 mmHg
Ao pk vel: 1.19 m/s
Area-P 1/2: 4.6 cm2
Height: 70 in
MV VTI: 3.84 cm2
S' Lateral: 2.5 cm
Weight: 3200 [oz_av]

## 2024-07-21 LAB — RESP PANEL BY RT-PCR (RSV, FLU A&B, COVID)  RVPGX2
Influenza A by PCR: NEGATIVE
Influenza B by PCR: NEGATIVE
Resp Syncytial Virus by PCR: NEGATIVE
SARS Coronavirus 2 by RT PCR: NEGATIVE

## 2024-07-21 LAB — COMPREHENSIVE METABOLIC PANEL WITH GFR
ALT: 252 U/L — ABNORMAL HIGH (ref 0–44)
AST: 614 U/L — ABNORMAL HIGH (ref 15–41)
Albumin: 2.8 g/dL — ABNORMAL LOW (ref 3.5–5.0)
Alkaline Phosphatase: 70 U/L (ref 38–126)
Anion gap: 8 (ref 5–15)
BUN: 10 mg/dL (ref 6–20)
CO2: 23 mmol/L (ref 22–32)
Calcium: 8.2 mg/dL — ABNORMAL LOW (ref 8.9–10.3)
Chloride: 102 mmol/L (ref 98–111)
Creatinine, Ser: 0.84 mg/dL (ref 0.61–1.24)
GFR, Estimated: >60 mL/min (ref >60)
Glucose, Bld: 141 mg/dL — ABNORMAL HIGH (ref 70–99)
Potassium: 3.7 mmol/L (ref 3.5–5.1)
Sodium: 133 mmol/L — ABNORMAL LOW (ref 135–145)
Total Bilirubin: 1.3 mg/dL — ABNORMAL HIGH (ref 0.0–1.2)
Total Protein: 6 g/dL — ABNORMAL LOW (ref 6.5–8.1)

## 2024-07-21 LAB — CBC WITH DIFFERENTIAL/PLATELET
Abs Immature Granulocytes: 0.03 K/uL (ref 0.00–0.07)
Basophils Absolute: 0 K/uL (ref 0.0–0.1)
Basophils Relative: 1 %
Eosinophils Absolute: 0.1 K/uL (ref 0.0–0.5)
Eosinophils Relative: 2 %
HCT: 33.8 % — ABNORMAL LOW (ref 39.0–52.0)
Hemoglobin: 11.8 g/dL — ABNORMAL LOW (ref 13.0–17.0)
Immature Granulocytes: 1 %
Lymphocytes Relative: 6 %
Lymphs Abs: 0.4 K/uL — ABNORMAL LOW (ref 0.7–4.0)
MCH: 34.3 pg — ABNORMAL HIGH (ref 26.0–34.0)
MCHC: 34.9 g/dL (ref 30.0–36.0)
MCV: 98.3 fL (ref 80.0–100.0)
Monocytes Absolute: 0.2 K/uL (ref 0.1–1.0)
Monocytes Relative: 3 %
Neutro Abs: 5.4 K/uL (ref 1.7–7.7)
Neutrophils Relative %: 87 %
Platelets: 172 K/uL (ref 150–400)
RBC: 3.44 MIL/uL — ABNORMAL LOW (ref 4.22–5.81)
RDW: 14.3 % (ref 11.5–15.5)
WBC: 6.1 K/uL (ref 4.0–10.5)
nRBC: 0 % (ref 0.0–0.2)

## 2024-07-21 LAB — HEPATITIS PANEL, ACUTE
HCV Ab: NONREACTIVE
Hep A IgM: NONREACTIVE
Hep B C IgM: NONREACTIVE
Hepatitis B Surface Ag: NONREACTIVE

## 2024-07-21 LAB — HEMOGLOBIN A1C
Hgb A1c MFr Bld: 5.2 % (ref 4.8–5.6)
Mean Plasma Glucose: 102.54 mg/dL

## 2024-07-21 LAB — MAGNESIUM: Magnesium: 1.8 mg/dL (ref 1.7–2.4)

## 2024-07-21 LAB — HIV ANTIBODY (ROUTINE TESTING W REFLEX): HIV Screen 4th Generation wRfx: NONREACTIVE

## 2024-07-21 LAB — LACTIC ACID, PLASMA: Lactic Acid, Venous: 1.3 mmol/L (ref 0.5–1.9)

## 2024-07-21 LAB — PHOSPHORUS: Phosphorus: 3.2 mg/dL (ref 2.5–4.6)

## 2024-07-21 MED ORDER — PANTOPRAZOLE SODIUM 40 MG IV SOLR
40.0000 mg | Freq: Two times a day (BID) | INTRAVENOUS | Status: DC
  Administered 2024-07-21 – 2024-07-23 (×5): 40 mg via INTRAVENOUS
  Filled 2024-07-21 (×5): qty 10

## 2024-07-21 MED ORDER — PANTOPRAZOLE SODIUM 40 MG IV SOLR
40.0000 mg | Freq: Two times a day (BID) | INTRAVENOUS | Status: DC
Start: 2024-07-21 — End: 2024-07-21

## 2024-07-21 NOTE — Progress Notes (Signed)
 Mobility Specialist Progress Note:    07/21/24 1703  Mobility  Activity Ambulated with assistance  Level of Assistance Standby assist, set-up cues, supervision of patient - no hands on  Assistive Device Front wheel walker  Distance Ambulated (ft) 90 ft  Range of Motion/Exercises Active;All extremities  Activity Response Tolerated well  Mobility visit 1 Mobility  Mobility Specialist Start Time (ACUTE ONLY) 1640  Mobility Specialist Stop Time (ACUTE ONLY) 1651  Mobility Specialist Time Calculation (min) (ACUTE ONLY) 11 min   Pt received sitting EOB, family in room. Agreeable to mobility, required SBA to stand and ambulate with RW. Tolerated well, asx throughout. Returned to room, left sitting EOB. All needs met.  Sherrilee Ditty Mobility Specialist Please contact via Special Educational Needs Teacher or  Rehab office at [phone]

## 2024-07-21 NOTE — Evaluation (Signed)
 Physical Therapy Evaluation Patient Details Name: [name] MRN: [id number] DOB: January 05, 1966 Today's Date: 07/21/2024  History of Present Illness  [name] is a 58 y.o. male with alcohol abuse, prior lacunar infarct, who presents after acute syncopal episode at home.  Patient reports he was sitting outside to enjoy the sun line when he became suddenly diaphoretic and lost consciousness.  He reports he was near family members at that time.  He reports when he came through the family members were very stressed and trying to get him to the hospital.  He recalls being agitated at that time.  No family members present at the time of my evaluation but family members reported to EDP that he was unresponsive for 10 seconds before becoming more alert.  They report he was acutely agitated when he became alert.  With EMS patient had another episode of LOC which lasted approximately 5 seconds.  Clinical Impression  Pt is a pleasant 58 y.o. male admitted for acute alcoholic hepatitis and sudden LOC with reports of nausea and dizziness. Pt was independent at baseline and currently transfers from supine to EOB with supervision. Pt was able to perform STS with minimal use of RW, but ultimately required use of RW for ambulation due to dizziness and nausea. Pt would benefit from continued skilled therapy services to address tolerance to ambulation and upright activities.      If plan is discharge home, recommend the following: A little help with walking and/or transfers   Can travel by private vehicle        Equipment Recommendations    Recommendations for Other Services       Functional Status Assessment Patient has had a recent decline in their functional status and demonstrates the ability to make significant improvements in function in a reasonable and predictable amount of time.     Precautions / Restrictions Precautions Precautions: None Restrictions Weight Bearing Restrictions Per Provider  Order: No      Mobility  Bed Mobility Overal bed mobility: Modified Independent             General bed mobility comments: Able to transition to EOB with superivision    Transfers Overall transfer level: Modified independent Equipment used: Rolling walker (2 wheels)               General transfer comment: Able to perform STS from EOB to RW with supervision    Ambulation/Gait Ambulation/Gait assistance: Contact guard assist Gait Distance (Feet): 80 Feet Assistive device: Rolling walker (2 wheels) Gait Pattern/deviations: Trunk flexed       General Gait Details: Pt limited by dizziness/HA and ambulation was cut short. Pt required the RW for steadiness, but otherwise shows no need for it once his strength returns.  Stairs            Wheelchair Mobility     Tilt Bed    Modified Rankin (Stroke Patients Only)       Balance Overall balance assessment: Modified Independent                                           Pertinent Vitals/Pain Pain Assessment Pain Assessment: No/denies pain    Home Living Family/patient expects to be discharged to:: Private residence Living Arrangements: Parent Available Help at Discharge: Family Type of Home: House Home Access: Stairs to enter Entrance Stairs-Rails: Can reach both Entrance  Stairs-Number of Steps: 3 steps to enter front door   Home Layout: One level Home Equipment: Rollator (4 wheels);Rolling Walker (2 wheels)      Prior Function Prior Level of Function : Independent/Modified Independent             Mobility Comments: independent at baseline ADLs Comments: independent at baseline     Extremity/Trunk Assessment   Upper Extremity Assessment Upper Extremity Assessment: Overall WFL for tasks assessed    Lower Extremity Assessment Lower Extremity Assessment: Overall WFL for tasks assessed    Cervical / Trunk Assessment Cervical / Trunk Assessment: Kyphotic   Communication   Communication Communication: No apparent difficulties    Cognition Arousal: Alert Behavior During Therapy: WFL for tasks assessed/performed   PT - Cognitive impairments: No apparent impairments                         Following commands: Intact       Cueing Cueing Techniques: Verbal cues     General Comments General comments (skin integrity, edema, etc.): NT    Exercises Other Exercises Other Exercises: gait for 80 ft with CGA and RW   Assessment/Plan    PT Assessment Patient needs continued PT services  PT Problem List Decreased activity tolerance;Decreased balance;Decreased mobility       PT Treatment Interventions Gait training;Therapeutic activities;Stair training    PT Goals (Current goals can be found in the Care Plan section)  Acute Rehab PT Goals Patient Stated Goal: to return home PT Goal Formulation: With patient Time For Goal Achievement: 08/04/24 Potential to Achieve Goals: Good    Frequency Min 1X/week     Co-evaluation               AM-PAC PT 6 Clicks Mobility  Outcome Measure Help needed turning from your back to your side while in a flat bed without using bedrails?: None Help needed moving from lying on your back to sitting on the side of a flat bed without using bedrails?: None Help needed moving to and from a bed to a chair (including a wheelchair)?: None Help needed standing up from a chair using your arms (e.g., wheelchair or bedside chair)?: A Little Help needed to walk in hospital room?: A Little Help needed climbing 3-5 steps with a railing? : A Little 6 Click Score: 21    End of Session Equipment Utilized During Treatment: Gait belt Activity Tolerance: Patient tolerated treatment well Patient left: in bed;with call bell/phone within reach Nurse Communication: Mobility status PT Visit Diagnosis: Dizziness and giddiness (R42);Other abnormalities of gait and mobility (R26.89)    Time:  8993-8982 PT Time Calculation (min) (ACUTE ONLY): 11 min   Charges:   PT Evaluation $PT Eval Low Complexity: 1 Low   PT General Charges $$ ACUTE PT VISIT: 1 Visit         Allena Bulls, SPT   Allena Bulls 07/21/2024, 1:11 PM

## 2024-07-21 NOTE — Progress Notes (Signed)
 PROGRESS NOTE    [name]  FMW:[id number] DOB: [birth year] DOA: 07/20/2024 PCP: Patient, No Pcp Per  Chief Complaint  Patient presents with   Dizziness    Hospital Course:  [name] 58 year old male with alcohol abuse, prior lacunar infarct, who presents after syncopal episode at home.  Patient describes a syncopal episode of sudden diaphoresis followed by loss of consciousness.  His family members report when he came to he was agitated and confused.  While with EMS he had an additional unresponsive episode for 5 seconds.  Initial BP in the ED was 68/48 which quickly resolved.  Labs revealed very elevated AST 712, ALT 177, sodium 134.  UA and UDS WNL.  CXR without acute cardiopulmonary disease.  Head CT with small chronic left thalamic lacunar infarct.  Patient was admitted for more workup.  Subjective: Patient reports he had 1 episode of hematemesis this morning.  He reports it is small-volume with small amount of blood.  No further nausea at this time.  He reports generally feeling unwell.  Also endorses some subjective fever today.  Objective: Vitals:   07/21/24 0025 07/21/24 0548 07/21/24 0740 07/21/24 1509  BP:  132/75 133/82 (!) 157/90  Pulse:  94 80 95  Resp:  16 18 18   Temp: 98.9 F (37.2 C) 99 F (37.2 C) 98.7 F (37.1 C) (!) 100.6 F (38.1 C)  TempSrc: Oral Oral  Oral  SpO2:  96% 98% 98%  Weight:      Height:        Intake/Output Summary (Last 24 hours) at 07/21/2024 1515 Last data filed at 07/21/2024 1100 Gross per 24 hour  Intake 1266.07 ml  Output 650 ml  Net 616.07 ml   Filed Weights   07/20/24 1734  Weight: 90.7 kg    Examination: General exam: Appears calm and comfortable, NAD  Respiratory system: No work of breathing, symmetric chest wall expansion Cardiovascular system: S1 & S2 heard, RRR.  Gastrointestinal system: Abdomen is nondistended, soft and nontender.  Neuro: Alert and oriented. No focal neurological deficits. Extremities:  Symmetric, expected ROM Skin: No rashes, lesions Psychiatry: Demonstrates appropriate judgement and insight. Mood & affect appropriate for situation.   Assessment & Plan:  Principal Problem:   Acute alcoholic hepatitis (HCC)    Syncope - With associated diaphoresis, loss of consciousness, agitation immediately following the event - DDx is broad.  Cannot rule out seizure event, though CK unremarkable. - EEG ordered, still pending.  Will likely be done tomorrow - Continue seizure precautions - Continue on telemetry monitoring - Lactic acidosis has resolved - High-sensitivity troponin 2.  No chest pain. - UDS and UA unremarkable.   - Orthostatic vitals WNL. - EKG on arrival NSR - Monitor for arrhythmia.  Telemetry ordered.  Echo with preserved EF, grade 1 diastolic dysfunction. - Continue fall precautions  Fever and diaphoresis - Fever today 100.6, first recorded fever.  Patient reports feeling generally unwell - Flu/COVID/RSV ordered and pending - This may also be secondary to alcoholic hepatitis. - UA and CXR unremarkable.  No leukocytosis.  Hematemesis - Had 1 small-volume vomit today with self-reported dark blood.  No further episodes.  Hemoglobin remains very stable - Given patient's profound alcohol use and findings concerning for cirrhosis on RUQUS, very likely has varices - Continue to monitor closely - If he has recurrent episodes we will consult GI for consideration of EGD   Elevated LFTs - AST 717, ALT 177 - Now with some vomiting today - Acute  viral hepatitis panel negative.  HIV negative. - This may all be acute alcoholic hepatitis.  He did have profound hypotension on arrival so there may be some component of shock liver though LFTs do appear to be improving - Continue to trend CMP - Encourage p.o. intake.  On IV fluids. - Right upper quadrant ultrasound without acute findings.  Consistent with hepatic steatosis though I suspect this is early cirrhosis.  Left  eyelid laceration - Small and superficial.  Would not be amendable to suture or glue - Keep area clean.  Apply Vaseline   Does not have a primary care physician - Consult TOC to assist with outpatient resource planning.   Alcohol abuse - Initiate CIWA protocol - Patient endorses two 40 ounce beers per day - EtOH level undetectable on arrival. - Folic acid, multivitamin, thiamine replacement ordered.   Hypotension - BP on arrival 68/48.  Spontaneously resolved.  Already improving.  Continue IV fluids - Monitor closely.  Does not take medications at home   Hx of CVA - Head CT: chronic lacunar infarct left thalamus no acute infarct. - LDL 87 - Hemoglobin A1c 5.2% - likely should be on statin and aspirin  at minimum.  Does not see PCP  Heart failure with preserved EF - Echocardiogram reveals EF 65 to 70%,  mild concentric LVH, grade 1 diastolic dysfunction.  No significant valvular abnormalities - Clinically appears euvolemic   DVT prophylaxis: Lovenox   Code Status: Full Code Disposition:  Inpatient pending clinical resolution  Consultants:    Procedures:    Antimicrobials:  Anti-infectives (From admission, onward)    None       Data Reviewed: I have personally reviewed following labs and imaging studies CBC: Recent Labs  Lab 07/20/24 1346 07/21/24 0516  WBC 6.4 6.1  NEUTROABS  --  5.4  HGB 13.4 11.8*  HCT 39.2 33.8*  MCV 99.5 98.3  PLT 179 172   Basic Metabolic Panel: Recent Labs  Lab 07/20/24 1346 07/21/24 0516  NA 134* 133*  K 3.5 3.7  CL 98 102  CO2 22 23  GLUCOSE 160* 141*  BUN 11 10  CREATININE 1.05 0.84  CALCIUM 8.7* 8.2*  MG  --  1.8  PHOS  --  3.2   GFR: Estimated Creatinine Clearance: 107.3 mL/min (by C-G formula based on SCr of 0.84 mg/dL). Liver Function Tests: Recent Labs  Lab 07/20/24 1346 07/21/24 0516  AST 712* 614*  ALT 177* 252*  ALKPHOS 75 70  BILITOT 1.0 1.3*  PROT 7.4 6.0*  ALBUMIN 3.5 2.8*   CBG: Recent Labs   Lab 07/20/24 1358  GLUCAP 120*    No results found for this or any previous visit (from the past 240 hours).   Radiology Studies: ECHOCARDIOGRAM COMPLETE Result Date: 07/21/2024    ECHOCARDIOGRAM REPORT   Patient Name:   [name] Date of Exam: 07/21/2024 Medical Rec #:  [id number]      Height:       70.0 in Accession #:    [id number]     Weight:       200.0 lb Date of Birth:  30-Jul-1966     BSA:          2.087 m Patient Age:    57 years       BP:           132/75 mmHg Patient Gender: M              HR:  95 bpm. Exam Location:  [facility] Procedure: 2D Echo, Cardiac Doppler and Color Doppler (Both Spectral and Color            Flow Doppler were utilized during procedure). Indications:     Syncope R55  History:         Patient has no prior history of Echocardiogram examinations.                  Signs/Symptoms:Syncope.  Sonographer:     Ashley McNeely-Sloane Referring Phys:  [id number] [name] Diagnosing Phys: Dwayne D Callwood MD IMPRESSIONS  1. Left ventricular ejection fraction, by estimation, is 65 to 70%. The left ventricle has normal function. The left ventricle has no regional wall motion abnormalities. There is mild concentric left ventricular hypertrophy. Left ventricular diastolic parameters are consistent with Grade I diastolic dysfunction (impaired relaxation).  2. Right ventricular systolic function is normal. The right ventricular size is normal. Mildly increased right ventricular wall thickness.  3. The mitral valve is grossly normal. No evidence of mitral valve regurgitation.  4. The aortic valve is normal in structure. Aortic valve regurgitation is not visualized. Conclusion(s)/Recommendation(s): Poor windows for evaluation of left ventricular function by transthoracic echocardiography. Would recommend an alternative means of evaluation. FINDINGS  Left Ventricle: Left ventricular ejection fraction, by estimation, is 65 to 70%. The left ventricle has normal function. The left  ventricle has no regional wall motion abnormalities. Strain was performed and the global longitudinal strain is indeterminate. The left ventricular internal cavity size was normal in size. There is mild concentric left ventricular hypertrophy. Left ventricular diastolic parameters are consistent with Grade I diastolic dysfunction (impaired relaxation). Right Ventricle: The right ventricular size is normal. Mildly increased right ventricular wall thickness. Right ventricular systolic function is normal. Left Atrium: Left atrial size was normal in size. Right Atrium: Right atrial size was normal in size. Pericardium: There is no evidence of pericardial effusion. Mitral Valve: The mitral valve is grossly normal. No evidence of mitral valve regurgitation. MV peak gradient, 3.9 mmHg. The mean mitral valve gradient is 2.0 mmHg. Tricuspid Valve: The tricuspid valve is normal in structure. Tricuspid valve regurgitation is not demonstrated. Aortic Valve: The aortic valve is normal in structure. Aortic valve regurgitation is not visualized. Aortic valve mean gradient measures 3.0 mmHg. Aortic valve peak gradient measures 5.7 mmHg. Aortic valve area, by VTI measures 3.44 cm. Pulmonic Valve: The pulmonic valve was normal in structure. Pulmonic valve regurgitation is not visualized. Aorta: The ascending aorta was not well visualized. IAS/Shunts: No atrial level shunt detected by color flow Doppler. Additional Comments: 3D was performed not requiring image post processing on an independent workstation and was indeterminate.  LEFT VENTRICLE PLAX 2D LVIDd:         4.80 cm   Diastology LVIDs:         2.50 cm   LV e' medial:    9.03 cm/s LV PW:         1.20 cm   LV E/e' medial:  5.9 LV IVS:        1.10 cm   LV e' lateral:   11.50 cm/s LVOT diam:     2.20 cm   LV E/e' lateral: 4.6 LV SV:         69 LV SV Index:   33 LVOT Area:     3.80 cm LV IVRT:       102 msec  RIGHT VENTRICLE RV Basal diam:  3.50 cm RV Mid diam:  2.60 cm RV S  prime:     15.00 cm/s TAPSE (M-mode): 1.9 cm LEFT ATRIUM             Index        RIGHT ATRIUM           Index LA diam:        3.50 cm 1.68 cm/m   RA Area:     12.00 cm LA Vol (A2C):   37.8 ml 18.11 ml/m  RA Volume:   21.70 ml  10.40 ml/m LA Vol (A4C):   33.4 ml 16.00 ml/m LA Biplane Vol: 36.8 ml 17.63 ml/m  AORTIC VALVE                    PULMONIC VALVE AV Area (Vmax):    3.45 cm     PV Vmax:        0.94 m/s AV Area (Vmean):   3.40 cm     PV Vmean:       67.900 cm/s AV Area (VTI):     3.44 cm     PV VTI:         0.162 m AV Vmax:           119.00 cm/s  PV Peak grad:   3.5 mmHg AV Vmean:          78.200 cm/s  PV Mean grad:   2.0 mmHg AV VTI:            0.200 m      RVOT Peak grad: 2 mmHg AV Peak Grad:      5.7 mmHg AV Mean Grad:      3.0 mmHg LVOT Vmax:         108.00 cm/s LVOT Vmean:        70.000 cm/s LVOT VTI:          0.181 m LVOT/AV VTI ratio: 0.90  AORTA Ao Root diam: 3.70 cm Ao Asc diam:  2.90 cm MITRAL VALVE MV Area (PHT): 4.60 cm    SHUNTS MV Area VTI:   3.84 cm    Systemic VTI:  0.18 m MV Peak grad:  3.9 mmHg    Systemic Diam: 2.20 cm MV Mean grad:  2.0 mmHg    Pulmonic VTI:  0.135 m MV Vmax:       0.98 m/s MV Vmean:      59.9 cm/s MV Decel Time: 165 msec MV E velocity: 53.10 cm/s MV A velocity: 74.90 cm/s MV E/A ratio:  0.71 Dwayne D Callwood MD Electronically signed by Cara JONETTA Lovelace MD Signature Date/Time: 07/21/2024/12:31:46 PM    Final    US  Abdomen Limited RUQ (LIVER/GB) Result Date: 07/20/2024 CLINICAL DATA:  Elevated liver enzymes EXAM: ULTRASOUND ABDOMEN LIMITED RIGHT UPPER QUADRANT COMPARISON:  06/27/2017 FINDINGS: Gallbladder: No gallstones or wall thickening visualized. No sonographic Murphy sign noted by sonographer. Common bile duct: Diameter: 3 mm Liver: Increased liver echotexture most commonly seen with hepatic steatosis. No focal parenchymal liver abnormalities. Portal vein is patent on color Doppler imaging with normal direction of blood flow towards the liver. Other:  None. IMPRESSION: 1. Increased liver echotexture most consistent with hepatic steatosis. 2. Otherwise unremarkable exam. Electronically Signed   By: Ozell Daring M.D.   On: 07/20/2024 18:30   DG Chest Portable 1 View Result Date: 07/20/2024 EXAM: 1 VIEW(S) XRAY OF THE CHEST 07/20/2024 02:44:00 PM COMPARISON: 07/18/2018 CLINICAL HISTORY: dyspnea dyspnea FINDINGS: LUNGS AND PLEURA: No focal pulmonary opacity. No  pulmonary edema. No pleural effusion. No pneumothorax. HEART AND MEDIASTINUM: No acute abnormality of the cardiac and mediastinal silhouettes. BONES AND SOFT TISSUES: No acute osseous abnormality. IMPRESSION: 1. No acute cardiopulmonary disease. Electronically signed by: Lynwood Seip MD 07/20/2024 02:47 PM EST RP Workstation: [id number]   CT Head Wo Contrast Result Date: 07/20/2024 EXAM: CT HEAD WITHOUT CONTRAST 07/20/2024 02:17:49 PM TECHNIQUE: CT of the head was performed without the administration of intravenous contrast. Automated exposure control, iterative reconstruction, and/or weight based adjustment of the mA/kV was utilized to reduce the radiation dose to as low as reasonably achievable. COMPARISON: Head CT 07/01/2013. CLINICAL HISTORY: 58 year old male. Dizzy. FINDINGS: BRAIN AND VENTRICLES: Chronic lacunar infarct left thalamus is unchanged. No acute hemorrhage. No evidence of acute infarct. Gray-white differentiation elsewhere remains normal. Background brain volume remains normal. Faint vascular calcifications bilateral basal ganglia. Calcified atherosclerosis at the skull base. No suspicious intracranial vascular hyperdensity. No hydrocephalus. No extra-axial collection. No mass effect or midline shift. ORBITS: No acute abnormality. SINUSES: Visible paranasal sinuses, middle ears and mastoids remain well aerated. SOFT TISSUES AND SKULL: No acute soft tissue abnormality. No skull fracture. IMPRESSION: 1. No acute intracranial abnormality. 2. Small chronic left thalamic lacunar infarct.  Electronically signed by: Helayne Hurst MD 07/20/2024 02:23 PM EST RP Workstation: [id number]    Scheduled Meds:  enoxaparin (LOVENOX) injection  40 mg Subcutaneous Q24H   folic acid  1 mg Oral Daily   multivitamin with minerals  1 tablet Oral Daily   pantoprazole (PROTONIX) IV  40 mg Intravenous Q12H   thiamine  100 mg Oral Daily   Or   thiamine  100 mg Intravenous Daily   Continuous Infusions:  lactated ringers 125 mL/hr at 07/21/24 0357     LOS: 0 days  MDM: Patient is high risk for one or more organ failure.  They necessitate ongoing hospitalization for continued IV therapies and subsequent lab monitoring. Total time spent interpreting labs and vitals, reviewing the medical record, coordinating care amongst consultants and care team members, directly assessing and discussing care with the patient and/or family: 55 min  Ray Gervasi, DO [facility]  To contact the attending physician between 7A-7P please use Epic Chat. To contact the covering physician during after hours 7P-7A, please review Amion.  07/21/2024, 3:15 PM   *This document has been created with the assistance of dictation software. Please excuse typographical errors. *

## 2024-07-21 NOTE — Progress Notes (Signed)
 Eeg done

## 2024-07-22 ENCOUNTER — Inpatient Hospital Stay

## 2024-07-22 DIAGNOSIS — K7010 Alcoholic hepatitis without ascites: Secondary | ICD-10-CM | POA: Diagnosis not present

## 2024-07-22 LAB — COMPREHENSIVE METABOLIC PANEL WITH GFR
ALT: 490 U/L — ABNORMAL HIGH (ref 0–44)
AST: 964 U/L — ABNORMAL HIGH (ref 15–41)
Albumin: 3.3 g/dL — ABNORMAL LOW (ref 3.5–5.0)
Alkaline Phosphatase: 76 U/L (ref 38–126)
Anion gap: 10 (ref 5–15)
BUN: 8 mg/dL (ref 6–20)
CO2: 23 mmol/L (ref 22–32)
Calcium: 8.8 mg/dL — ABNORMAL LOW (ref 8.9–10.3)
Chloride: 98 mmol/L (ref 98–111)
Creatinine, Ser: 0.9 mg/dL (ref 0.61–1.24)
GFR, Estimated: >60 mL/min (ref >60)
Glucose, Bld: 87 mg/dL (ref 70–99)
Potassium: 4 mmol/L (ref 3.5–5.1)
Sodium: 131 mmol/L — ABNORMAL LOW (ref 135–145)
Total Bilirubin: 1 mg/dL (ref 0.0–1.2)
Total Protein: 6.6 g/dL (ref 6.5–8.1)

## 2024-07-22 LAB — CBC WITH DIFFERENTIAL/PLATELET
Abs Immature Granulocytes: 0.02 K/uL (ref 0.00–0.07)
Basophils Absolute: 0 K/uL (ref 0.0–0.1)
Basophils Relative: 1 %
Eosinophils Absolute: 0.2 K/uL (ref 0.0–0.5)
Eosinophils Relative: 5 %
HCT: 37.4 % — ABNORMAL LOW (ref 39.0–52.0)
Hemoglobin: 13 g/dL (ref 13.0–17.0)
Immature Granulocytes: 0 %
Lymphocytes Relative: 6 %
Lymphs Abs: 0.3 K/uL — ABNORMAL LOW (ref 0.7–4.0)
MCH: 34.1 pg — ABNORMAL HIGH (ref 26.0–34.0)
MCHC: 34.8 g/dL (ref 30.0–36.0)
MCV: 98.2 fL (ref 80.0–100.0)
Monocytes Absolute: 0.3 K/uL (ref 0.1–1.0)
Monocytes Relative: 5 %
Neutro Abs: 4.3 K/uL (ref 1.7–7.7)
Neutrophils Relative %: 83 %
Platelets: 131 K/uL — ABNORMAL LOW (ref 150–400)
RBC: 3.81 MIL/uL — ABNORMAL LOW (ref 4.22–5.81)
RDW: 14.2 % (ref 11.5–15.5)
WBC: 5.2 K/uL (ref 4.0–10.5)
nRBC: 0 % (ref 0.0–0.2)

## 2024-07-22 LAB — IRON AND TIBC
Iron: 33 ug/dL — ABNORMAL LOW (ref 45–182)
Saturation Ratios: 13 % — ABNORMAL LOW (ref 17.9–39.5)
TIBC: 255 ug/dL (ref 250–450)
UIBC: 222 ug/dL

## 2024-07-22 LAB — PHOSPHORUS: Phosphorus: 3.8 mg/dL (ref 2.5–4.6)

## 2024-07-22 LAB — MAGNESIUM: Magnesium: 2.1 mg/dL (ref 1.7–2.4)

## 2024-07-22 LAB — FERRITIN: Ferritin: 1422 ng/mL — ABNORMAL HIGH (ref 24–336)

## 2024-07-22 MED ORDER — GADOBUTROL 1 MMOL/ML IV SOLN
9.0000 mL | Freq: Once | INTRAVENOUS | Status: AC | PRN
  Administered 2024-07-22: 9 mL via INTRAVENOUS

## 2024-07-22 MED ORDER — DIAZEPAM 5 MG PO TABS
5.0000 mg | ORAL_TABLET | Freq: Once | ORAL | Status: AC | PRN
  Administered 2024-07-22: 5 mg via ORAL
  Filled 2024-07-22: qty 1

## 2024-07-22 NOTE — Progress Notes (Signed)
 PROGRESS NOTE    [name]  FMW:[id number] DOB: Nov 30, 1965 DOA: 07/20/2024 PCP: Patient, No Pcp Per  Chief Complaint  Patient presents with   Dizziness    Hospital Course:  [name] 58 year old male with alcohol abuse, prior lacunar infarct, who presents after syncopal episode at home.  Patient describes a syncopal episode of sudden diaphoresis followed by loss of consciousness.  His family members report when he came to he was agitated and confused.  While with EMS he had an additional unresponsive episode for 5 seconds.  Initial BP in the ED was 68/48 which quickly resolved.  Labs revealed very elevated AST 712, ALT 177, sodium 134.  UA and UDS WNL.  CXR without acute cardiopulmonary disease.  Head CT with small chronic left thalamic lacunar infarct.  Patient was admitted for workup.  Workup has revealed worsening LFTs and ongoing episodes of diaphoresis with intermittent fever.  Subjective: Patient continues to report that he feels unwell.  He reports he had an episode of sudden diaphoresis and near syncope this morning.  Bedside RN and PT report patient was briefly dizzy but resolved.  No loss of consciousness reported.   Objective: Vitals:   07/22/24 0338 07/22/24 0853 07/22/24 1053 07/22/24 1701  BP: 132/88 136/87 112/81 118/81  Pulse:  87 100 98  Resp: 16 16  16   Temp: 98.2 F (36.8 C) 99.4 F (37.4 C)  (!) 100.7 F (38.2 C)  TempSrc:    Oral  SpO2: 98% 98%  97%  Weight:      Height:        Intake/Output Summary (Last 24 hours) at 07/22/2024 1755 Last data filed at 07/22/2024 1300 Gross per 24 hour  Intake 0 ml  Output 200 ml  Net -200 ml   Filed Weights   07/20/24 1734  Weight: 90.7 kg    Examination: General exam: Appears calm and comfortable, NAD  Respiratory system: No work of breathing, symmetric chest wall expansion Cardiovascular system: S1 & S2 heard, RRR.  Gastrointestinal system: Abdomen is nondistended, soft and nontender.  Neuro: Alert  and oriented. No focal neurological deficits. Extremities: Symmetric, expected ROM Skin: No rashes, lesions Psychiatry: Demonstrates appropriate judgement and insight. Mood & affect appropriate for situation.   Assessment & Plan:  Principal Problem:   Acute alcoholic hepatitis (HCC)    Syncope - With associated diaphoresis, loss of consciousness, agitation immediately following the event - DDx is broad.  Cannot rule out seizure event, though CK unremarkable. - Brain MRI with chronic lacunar infarct.  No other abnormality. - EEG done today, still pending read - Continue seizure precautions - Continue to monitor on telemetry, no acute event so far - Lactic acidosis resolved. - High-sensitivity troponin 2.  No chest pain. - UDS and UA unremarkable.   - Orthostatic vitals WNL. - EKG on arrival NSR - Monitor for arrhythmia. Echo with preserved EF, grade 1 diastolic dysfunction. - Continue fall precautions  Fever and diaphoresis - Continues to have intermittent fever and reports generally feeling unwell - Flu/COVID/RSV negative.   - This may also be secondary to alcoholic hepatitis. - UA and CXR unremarkable.  No leukocytosis.  Hematemesis - Had 1 small-volume vomit today with self-reported dark blood.  No further episodes.  Hemoglobin remains very stable - Given patient's profound alcohol use and findings concerning for cirrhosis on RUQUS, very likely has varices - Continue to monitor closely  Elevated LFTs - AST 717, ALT 177 on arrival, now rising -- Acute viral  hepatitis panel negative.  HIV negative. - This may all be acute alcoholic hepatitis.  He did have profound hypotension on arrival so there may be some component of shock liver though LFTs do appear to be improving - Consulted gastroenterology who has broadened workup with ANA, mitochondrial antibodies, anti-smooth muscle antibody, alpha-1 antitrypsin, ceruloplasmin, EBV, CMV, iron studies. - Continue to trend CMP -  Encourage p.o. intake.  On IV fluids. - Right upper quadrant ultrasound without acute findings.  Consistent with hepatic steatosis though I suspect this is early cirrhosis.  Left eyelid laceration - Small and superficial.  Would not be amendable to suture or glue - Keep area clean.  Apply Vaseline   Does not have a primary care physician - Consult TOC to assist with outpatient resource planning.   Alcohol abuse - Initiate CIWA protocol - Patient endorses two 40 ounce beers per day - EtOH level undetectable on arrival. - Folic acid, multivitamin, thiamine replacement ordered.   Hypotension - BP on arrival 68/48.  Spontaneously resolved.  Already improving.   - Monitor closely.  Does not take medications at home   Hx of CVA - Head CT: chronic lacunar infarct left thalamus no acute infarct. - LDL 87 - Hemoglobin A1c 5.2% - likely should be on statin and aspirin  at minimum.  Does not see PCP  Heart failure with preserved EF - Echocardiogram reveals EF 65 to 70%,  mild concentric LVH, grade 1 diastolic dysfunction.  No significant valvular abnormalities - Clinically appears euvolemic   DVT prophylaxis: Lovenox   Code Status: Full Code Disposition:  Inpatient pending clinical resolution  Consultants:  Treatment Team:  Consulting Physician: Jinny Carmine, MD  Procedures:    Antimicrobials:  Anti-infectives (From admission, onward)    None       Data Reviewed: I have personally reviewed following labs and imaging studies CBC: Recent Labs  Lab 07/20/24 1346 07/21/24 0516 07/22/24 0519  WBC 6.4 6.1 5.2  NEUTROABS  --  5.4 4.3  HGB 13.4 11.8* 13.0  HCT 39.2 33.8* 37.4*  MCV 99.5 98.3 98.2  PLT 179 172 131*   Basic Metabolic Panel: Recent Labs  Lab 07/20/24 1346 07/21/24 0516 07/22/24 0519  NA 134* 133* 131*  K 3.5 3.7 4.0  CL 98 102 98  CO2 22 23 23   GLUCOSE 160* 141* 87  BUN 11 10 8   CREATININE 1.05 0.84 0.90  CALCIUM 8.7* 8.2* 8.8*  MG  --  1.8 2.1   PHOS  --  3.2 3.8   GFR: Estimated Creatinine Clearance: 100.1 mL/min (by C-G formula based on SCr of 0.9 mg/dL). Liver Function Tests: Recent Labs  Lab 07/20/24 1346 07/21/24 0516 07/22/24 0519  AST 712* 614* 964*  ALT 177* 252* 490*  ALKPHOS 75 70 76  BILITOT 1.0 1.3* 1.0  PROT 7.4 6.0* 6.6  ALBUMIN 3.5 2.8* 3.3*   CBG: Recent Labs  Lab 07/20/24 1358  GLUCAP 120*    Recent Results (from the past 240 hours)  Resp panel by RT-PCR (RSV, Flu A&B, Covid) Anterior Nasal Swab     Status: None   Collection Time: 07/21/24  3:20 PM   Specimen: Anterior Nasal Swab  Result Value Ref Range Status   SARS Coronavirus 2 by RT PCR NEGATIVE NEGATIVE Final    Comment: (NOTE) SARS-CoV-2 target nucleic acids are NOT DETECTED.  The SARS-CoV-2 RNA is generally detectable in upper respiratory specimens during the acute phase of infection. The lowest concentration of SARS-CoV-2 viral copies this  assay can detect is 138 copies/mL. A negative result does not preclude SARS-Cov-2 infection and should not be used as the sole basis for treatment or other patient management decisions. A negative result may occur with  improper specimen collection/handling, submission of specimen other than nasopharyngeal swab, presence of viral mutation(s) within the areas targeted by this assay, and inadequate number of viral copies(<138 copies/mL). A negative result must be combined with clinical observations, patient history, and epidemiological information. The expected result is Negative.  Fact Sheet for Patients:  bloggercourse.com  Fact Sheet for Healthcare Providers:  seriousbroker.it  This test is no t yet approved or cleared by the [place]  FDA and  has been authorized for detection and/or diagnosis of SARS-CoV-2 by FDA under an Emergency Use Authorization (EUA). This EUA will remain  in effect (meaning this test can be used) for the duration  of the COVID-19 declaration under Section 564(b)(1) of the Act, 21 U.S.C.section 360bbb-3(b)(1), unless the authorization is terminated  or revoked sooner.       Influenza A by PCR NEGATIVE NEGATIVE Final   Influenza B by PCR NEGATIVE NEGATIVE Final    Comment: (NOTE) The Xpert Xpress SARS-CoV-2/FLU/RSV plus assay is intended as an aid in the diagnosis of influenza from Nasopharyngeal swab specimens and should not be used as a sole basis for treatment. Nasal washings and aspirates are unacceptable for Xpert Xpress SARS-CoV-2/FLU/RSV testing.  Fact Sheet for Patients: bloggercourse.com  Fact Sheet for Healthcare Providers: seriousbroker.it  This test is not yet approved or cleared by the [place]  FDA and has been authorized for detection and/or diagnosis of SARS-CoV-2 by FDA under an Emergency Use Authorization (EUA). This EUA will remain in effect (meaning this test can be used) for the duration of the COVID-19 declaration under Section 564(b)(1) of the Act, 21 U.S.C. section 360bbb-3(b)(1), unless the authorization is terminated or revoked.     Resp Syncytial Virus by PCR NEGATIVE NEGATIVE Final    Comment: (NOTE) Fact Sheet for Patients: bloggercourse.com  Fact Sheet for Healthcare Providers: seriousbroker.it  This test is not yet approved or cleared by the [place]  FDA and has been authorized for detection and/or diagnosis of SARS-CoV-2 by FDA under an Emergency Use Authorization (EUA). This EUA will remain in effect (meaning this test can be used) for the duration of the COVID-19 declaration under Section 564(b)(1) of the Act, 21 U.S.C. section 360bbb-3(b)(1), unless the authorization is terminated or revoked.  Performed at [facility], [address] [place], [place] [address]      Radiology Studies: MR BRAIN W WO CONTRAST Result Date:  07/22/2024 EXAM: MRI BRAIN WITH AND WITHOUT CONTRAST 07/22/2024 03:23:15 PM TECHNIQUE: Multiplanar multisequence MRI of the head/brain was performed with and without the administration of intravenous contrast. COMPARISON: Head CT 07/20/2024. CLINICAL HISTORY: recurrent syncopal episodes, Ct with remote infarct, r/o small lesions or worsening infarcts FINDINGS: BRAIN AND VENTRICLES: There is no evidence of an acute infarct, intracranial hemorrhage, mass, midline shift, hydrocephalus, or extra-axial fluid collection. Cerebral volume is within normal limits for age. A chronic lacunar infarct is again noted in the left thalamus. No significant white matter disease is seen for age. No abnormal enhancement is identified. Major intracranial vascular flow voids are preserved. ORBITS: No acute abnormality. SINUSES: Mucus retention cyst in the left maxillary sinus. Mild scattered mucosal thickening elsewhere in the paranasal sinuses. Clear mastoid air cells. BONES AND SOFT TISSUES: Normal bone marrow signal and enhancement. No acute soft tissue abnormality. IMPRESSION: 1. No  acute intracranial abnormality. 2. Chronic lacunar infarct in the left thalamus. Electronically signed by: Dasie Hamburg MD 07/22/2024 05:03 PM EST RP Workstation: [id number]   ECHOCARDIOGRAM COMPLETE Result Date: 07/21/2024    ECHOCARDIOGRAM REPORT   Patient Name:   [name] Date of Exam: 07/21/2024 Medical Rec #:  [id number]      Height:       70.0 in Accession #:    [id number]     Weight:       200.0 lb Date of Birth:  11/11/1965     BSA:          2.087 m Patient Age:    57 years       BP:           132/75 mmHg Patient Gender: M              HR:           95 bpm. Exam Location:  [facility] Procedure: 2D Echo, Cardiac Doppler and Color Doppler (Both Spectral and Color            Flow Doppler were utilized during procedure). Indications:     Syncope R55  History:         Patient has no prior history of Echocardiogram examinations.                   Signs/Symptoms:Syncope.  Sonographer:     Ashley McNeely-Sloane Referring Phys:  [id number] Beda Dula Diagnosing Phys: Dwayne D Callwood MD IMPRESSIONS  1. Left ventricular ejection fraction, by estimation, is 65 to 70%. The left ventricle has normal function. The left ventricle has no regional wall motion abnormalities. There is mild concentric left ventricular hypertrophy. Left ventricular diastolic parameters are consistent with Grade I diastolic dysfunction (impaired relaxation).  2. Right ventricular systolic function is normal. The right ventricular size is normal. Mildly increased right ventricular wall thickness.  3. The mitral valve is grossly normal. No evidence of mitral valve regurgitation.  4. The aortic valve is normal in structure. Aortic valve regurgitation is not visualized. Conclusion(s)/Recommendation(s): Poor windows for evaluation of left ventricular function by transthoracic echocardiography. Would recommend an alternative means of evaluation. FINDINGS  Left Ventricle: Left ventricular ejection fraction, by estimation, is 65 to 70%. The left ventricle has normal function. The left ventricle has no regional wall motion abnormalities. Strain was performed and the global longitudinal strain is indeterminate. The left ventricular internal cavity size was normal in size. There is mild concentric left ventricular hypertrophy. Left ventricular diastolic parameters are consistent with Grade I diastolic dysfunction (impaired relaxation). Right Ventricle: The right ventricular size is normal. Mildly increased right ventricular wall thickness. Right ventricular systolic function is normal. Left Atrium: Left atrial size was normal in size. Right Atrium: Right atrial size was normal in size. Pericardium: There is no evidence of pericardial effusion. Mitral Valve: The mitral valve is grossly normal. No evidence of mitral valve regurgitation. MV peak gradient, 3.9 mmHg. The mean mitral valve gradient is  2.0 mmHg. Tricuspid Valve: The tricuspid valve is normal in structure. Tricuspid valve regurgitation is not demonstrated. Aortic Valve: The aortic valve is normal in structure. Aortic valve regurgitation is not visualized. Aortic valve mean gradient measures 3.0 mmHg. Aortic valve peak gradient measures 5.7 mmHg. Aortic valve area, by VTI measures 3.44 cm. Pulmonic Valve: The pulmonic valve was normal in structure. Pulmonic valve regurgitation is not visualized. Aorta: The ascending aorta was not well visualized. IAS/Shunts: No atrial level shunt detected  by color flow Doppler. Additional Comments: 3D was performed not requiring image post processing on an independent workstation and was indeterminate.  LEFT VENTRICLE PLAX 2D LVIDd:         4.80 cm   Diastology LVIDs:         2.50 cm   LV e' medial:    9.03 cm/s LV PW:         1.20 cm   LV E/e' medial:  5.9 LV IVS:        1.10 cm   LV e' lateral:   11.50 cm/s LVOT diam:     2.20 cm   LV E/e' lateral: 4.6 LV SV:         69 LV SV Index:   33 LVOT Area:     3.80 cm LV IVRT:       102 msec  RIGHT VENTRICLE RV Basal diam:  3.50 cm RV Mid diam:    2.60 cm RV S prime:     15.00 cm/s TAPSE (M-mode): 1.9 cm LEFT ATRIUM             Index        RIGHT ATRIUM           Index LA diam:        3.50 cm 1.68 cm/m   RA Area:     12.00 cm LA Vol (A2C):   37.8 ml 18.11 ml/m  RA Volume:   21.70 ml  10.40 ml/m LA Vol (A4C):   33.4 ml 16.00 ml/m LA Biplane Vol: 36.8 ml 17.63 ml/m  AORTIC VALVE                    PULMONIC VALVE AV Area (Vmax):    3.45 cm     PV Vmax:        0.94 m/s AV Area (Vmean):   3.40 cm     PV Vmean:       67.900 cm/s AV Area (VTI):     3.44 cm     PV VTI:         0.162 m AV Vmax:           119.00 cm/s  PV Peak grad:   3.5 mmHg AV Vmean:          78.200 cm/s  PV Mean grad:   2.0 mmHg AV VTI:            0.200 m      RVOT Peak grad: 2 mmHg AV Peak Grad:      5.7 mmHg AV Mean Grad:      3.0 mmHg LVOT Vmax:         108.00 cm/s LVOT Vmean:        70.000 cm/s  LVOT VTI:          0.181 m LVOT/AV VTI ratio: 0.90  AORTA Ao Root diam: 3.70 cm Ao Asc diam:  2.90 cm MITRAL VALVE MV Area (PHT): 4.60 cm    SHUNTS MV Area VTI:   3.84 cm    Systemic VTI:  0.18 m MV Peak grad:  3.9 mmHg    Systemic Diam: 2.20 cm MV Mean grad:  2.0 mmHg    Pulmonic VTI:  0.135 m MV Vmax:       0.98 m/s MV Vmean:      59.9 cm/s MV Decel Time: 165 msec MV E velocity: 53.10 cm/s MV A velocity: 74.90 cm/s MV E/A ratio:  0.71 Dwayne  JONETTA Lovelace MD Electronically signed by Cara JONETTA Lovelace MD Signature Date/Time: 07/21/2024/12:31:46 PM    Final    US  Abdomen Limited RUQ (LIVER/GB) Result Date: 07/20/2024 CLINICAL DATA:  Elevated liver enzymes EXAM: ULTRASOUND ABDOMEN LIMITED RIGHT UPPER QUADRANT COMPARISON:  06/27/2017 FINDINGS: Gallbladder: No gallstones or wall thickening visualized. No sonographic Murphy sign noted by sonographer. Common bile duct: Diameter: 3 mm Liver: Increased liver echotexture most commonly seen with hepatic steatosis. No focal parenchymal liver abnormalities. Portal vein is patent on color Doppler imaging with normal direction of blood flow towards the liver. Other: None. IMPRESSION: 1. Increased liver echotexture most consistent with hepatic steatosis. 2. Otherwise unremarkable exam. Electronically Signed   By: Ozell Daring M.D.   On: 07/20/2024 18:30    Scheduled Meds:  enoxaparin (LOVENOX) injection  40 mg Subcutaneous Q24H   folic acid  1 mg Oral Daily   multivitamin with minerals  1 tablet Oral Daily   pantoprazole (PROTONIX) IV  40 mg Intravenous Q12H   thiamine  100 mg Oral Daily   Or   thiamine  100 mg Intravenous Daily   Continuous Infusions:     LOS: 1 day  MDM: Patient is high risk for one or more organ failure.  They necessitate ongoing hospitalization for continued IV therapies and subsequent lab monitoring. Total time spent interpreting labs and vitals, reviewing the medical record, coordinating care amongst consultants and care team members,  directly assessing and discussing care with the patient and/or family: 55 min  Marye Eagen, DO [facility]  To contact the attending physician between 7A-7P please use Epic Chat. To contact the covering physician during after hours 7P-7A, please review Amion.  07/22/2024, 5:55 PM   *This document has been created with the assistance of dictation software. Please excuse typographical errors. *

## 2024-07-22 NOTE — Consult Note (Signed)
 Rogelia Copping, MD [facility]  [address], [address] [place], [place] [address] Phone: [phone] Fax : [phone]  Consultation  Referring Provider:     Dr. Leesa Primary Care Physician:  Patient, No Pcp Per Primary Gastroenterologist: Sampson         Reason for Consultation:     Abnormal enzymes  Date of Admission:  07/20/2024 Date of Consultation:  07/22/2024         HPI:   [name] is a 58 y.o. male with a history of alcohol use with 2-3 beers on a daily basis and was admitted with an episode of unresponsiveness with dizziness and lightheadedness.  The patient had EMS called by family members after the episode of unresponsiveness and was found to have a blood pressure 85/65 and IV fluids were started.  The patient had previously been in his normal state of health.  When the patient had been admitted to the emergency department he was found to blood pressure 60 systolic in the triage area. The patient has a history of a prior lacunar infarct.  His liver enzymes were checked and were abnormal and showed:  Component     Latest Ref Rng 07/20/2024 07/21/2024 07/22/2024  AST     15 - 41 U/L 712 (H)  614 (H)  964 (H)   ALT     0 - 44 U/L 177 (H)  252 (H)  490 (H)   Alkaline Phosphatase     38 - 126 U/L 75  70  76   Total Bilirubin     0.0 - 1.2 mg/dL 1.0  1.3 (H)  1.0    He does report having a episode similar to this many years ago with no etiology found at that time.  The patient's acute viral hepatitis panel was negative.  The patient's right upper quadrant ultrasound showed increased liver echogenicity texture most consistent with hepatic steatosis.  The patient's head CT did not show any acute intracranial abnormalities.  The patient CBC did not show any sign of thrombocytopenia.  The patient is total bilirubin was normal on admission and is also normal today.  The patient states that he does not take any Advil  Aleve  Motrin  BCs or Goody powders.  He also denies having any  black stools or bloody stools.  He does not take any over-the-counter medications or herbal medication.  History reviewed. No pertinent past medical history.  Past Surgical History:  Procedure Laterality Date  . LITHOTRIPSY      Prior to Admission medications   Medication Sig Start Date End Date Taking? Authorizing Provider  famotidine  (PEPCID ) 20 MG tablet Take 1 tablet (20 mg total) by mouth 2 (two) times daily for 7 days. 07/18/18 07/25/18  Siadecki, Sebastian, MD  HYDROcodone -acetaminophen  (NORCO/VICODIN) 5-325 MG tablet Take 1 tablet by mouth every 6 (six) hours as needed for moderate pain. Patient not taking: Reported on 07/20/2024 06/10/18   Saunders Shona CROME, PA-C  ibuprofen  (ADVIL ,MOTRIN ) 600 MG tablet Take 1 tablet (600 mg total) by mouth every 8 (eight) hours as needed. Patient not taking: Reported on 07/20/2024 06/10/18   Saunders Shona CROME, PA-C  methocarbamol  (ROBAXIN ) 500 MG tablet Take 1 tablet (500 mg total) by mouth every 6 (six) hours as needed for muscle spasms. Patient not taking: Reported on 07/20/2024 06/10/18   Saunders Shona CROME, PA-C    History reviewed. No pertinent family history.   Social History   Tobacco Use  . Smoking status:  Never  . Smokeless tobacco: Never  Vaping Use  . Vaping status: Never Used  Substance Use Topics  . Alcohol use: Yes  . Drug use: No    Allergies as of 07/20/2024 - Review Complete 07/20/2024  Allergen Reaction Noted  . Shellfish protein-containing drug products Anaphylaxis 10/19/2021    Review of Systems:    All systems reviewed and negative except where noted in HPI.   Physical Exam:  Vital signs in last 24 hours: Temp:  [98.2 F (36.8 C)-100.6 F (38.1 C)] 99.4 F (37.4 C) (11/04 0853) Pulse Rate:  [87-104] 100 (11/04 1053) Resp:  [16-18] 16 (11/04 0853) BP: (112-157)/(81-91) 112/81 (11/04 1053) SpO2:  [98 %] 98 % (11/04 0853) Last BM Date : 07/22/24 General:   Pleasant, cooperative in NAD Head:  Normocephalic and  atraumatic. Eyes:   No icterus.   Conjunctiva pink. PERRLA. Ears:  Normal auditory acuity. Neck:  Supple; no masses or thyroidomegaly Lungs: Respirations even and unlabored. Lungs clear to auscultation bilaterally.   No wheezes, crackles, or rhonchi.  Heart:  Regular rate and rhythm;  Without murmur, clicks, rubs or gallops Abdomen:  Soft, nondistended, nontender. Normal bowel sounds. No appreciable masses or hepatomegaly.  No rebound or guarding.  Rectal:  Not performed. Msk:  Symmetrical without gross deformities.   Extremities:  Without edema, cyanosis or clubbing. Neurologic:  Alert and oriented x3;  grossly normal neurologically. Skin:  Intact without significant lesions or rashes. Cervical Nodes:  No significant cervical adenopathy. Psych:  Alert and cooperative. Normal affect.  LAB RESULTS: Recent Labs    07/20/24 1346 07/21/24 0516 07/22/24 0519  WBC 6.4 6.1 5.2  HGB 13.4 11.8* 13.0  HCT 39.2 33.8* 37.4*  PLT 179 172 131*   BMET Recent Labs    07/20/24 1346 07/21/24 0516 07/22/24 0519  NA 134* 133* 131*  K 3.5 3.7 4.0  CL 98 102 98  CO2 22 23 23   GLUCOSE 160* 141* 87  BUN 11 10 8   CREATININE 1.05 0.84 0.90  CALCIUM 8.7* 8.2* 8.8*   LFT Recent Labs    07/22/24 0519  PROT 6.6  ALBUMIN 3.3*  AST 964*  ALT 490*  ALKPHOS 76  BILITOT 1.0   PT/INR No results for input(s): LABPROT, INR in the last 72 hours.  STUDIES: ECHOCARDIOGRAM COMPLETE Result Date: 07/21/2024    ECHOCARDIOGRAM REPORT   Patient Name:   [name] Date of Exam: 07/21/2024 Medical Rec #:  [id number]      Height:       70.0 in Accession #:    [id number]     Weight:       200.0 lb Date of Birth:  01/18/1966     BSA:          2.087 m Patient Age:    57 years       BP:           132/75 mmHg Patient Gender: M              HR:           95 bpm. Exam Location:  [facility] Procedure: 2D Echo, Cardiac Doppler and Color Doppler (Both Spectral and Color            Flow Doppler were utilized during  procedure). Indications:     Syncope R55  History:         Patient has no prior history of Echocardiogram examinations.  Signs/Symptoms:Syncope.  Sonographer:     Ashley McNeely-Sloane Referring Phys:  [id number] ALEXANDRA DEZII Diagnosing Phys: Dwayne D Callwood MD IMPRESSIONS  1. Left ventricular ejection fraction, by estimation, is 65 to 70%. The left ventricle has normal function. The left ventricle has no regional wall motion abnormalities. There is mild concentric left ventricular hypertrophy. Left ventricular diastolic parameters are consistent with Grade I diastolic dysfunction (impaired relaxation).  2. Right ventricular systolic function is normal. The right ventricular size is normal. Mildly increased right ventricular wall thickness.  3. The mitral valve is grossly normal. No evidence of mitral valve regurgitation.  4. The aortic valve is normal in structure. Aortic valve regurgitation is not visualized. Conclusion(s)/Recommendation(s): Poor windows for evaluation of left ventricular function by transthoracic echocardiography. Would recommend an alternative means of evaluation. FINDINGS  Left Ventricle: Left ventricular ejection fraction, by estimation, is 65 to 70%. The left ventricle has normal function. The left ventricle has no regional wall motion abnormalities. Strain was performed and the global longitudinal strain is indeterminate. The left ventricular internal cavity size was normal in size. There is mild concentric left ventricular hypertrophy. Left ventricular diastolic parameters are consistent with Grade I diastolic dysfunction (impaired relaxation). Right Ventricle: The right ventricular size is normal. Mildly increased right ventricular wall thickness. Right ventricular systolic function is normal. Left Atrium: Left atrial size was normal in size. Right Atrium: Right atrial size was normal in size. Pericardium: There is no evidence of pericardial effusion. Mitral Valve: The  mitral valve is grossly normal. No evidence of mitral valve regurgitation. MV peak gradient, 3.9 mmHg. The mean mitral valve gradient is 2.0 mmHg. Tricuspid Valve: The tricuspid valve is normal in structure. Tricuspid valve regurgitation is not demonstrated. Aortic Valve: The aortic valve is normal in structure. Aortic valve regurgitation is not visualized. Aortic valve mean gradient measures 3.0 mmHg. Aortic valve peak gradient measures 5.7 mmHg. Aortic valve area, by VTI measures 3.44 cm. Pulmonic Valve: The pulmonic valve was normal in structure. Pulmonic valve regurgitation is not visualized. Aorta: The ascending aorta was not well visualized. IAS/Shunts: No atrial level shunt detected by color flow Doppler. Additional Comments: 3D was performed not requiring image post processing on an independent workstation and was indeterminate.  LEFT VENTRICLE PLAX 2D LVIDd:         4.80 cm   Diastology LVIDs:         2.50 cm   LV e' medial:    9.03 cm/s LV PW:         1.20 cm   LV E/e' medial:  5.9 LV IVS:        1.10 cm   LV e' lateral:   11.50 cm/s LVOT diam:     2.20 cm   LV E/e' lateral: 4.6 LV SV:         69 LV SV Index:   33 LVOT Area:     3.80 cm LV IVRT:       102 msec  RIGHT VENTRICLE RV Basal diam:  3.50 cm RV Mid diam:    2.60 cm RV S prime:     15.00 cm/s TAPSE (M-mode): 1.9 cm LEFT ATRIUM             Index        RIGHT ATRIUM           Index LA diam:        3.50 cm 1.68 cm/m   RA Area:     12.00 cm LA Vol (  A2C):   37.8 ml 18.11 ml/m  RA Volume:   21.70 ml  10.40 ml/m LA Vol (A4C):   33.4 ml 16.00 ml/m LA Biplane Vol: 36.8 ml 17.63 ml/m  AORTIC VALVE                    PULMONIC VALVE AV Area (Vmax):    3.45 cm     PV Vmax:        0.94 m/s AV Area (Vmean):   3.40 cm     PV Vmean:       67.900 cm/s AV Area (VTI):     3.44 cm     PV VTI:         0.162 m AV Vmax:           119.00 cm/s  PV Peak grad:   3.5 mmHg AV Vmean:          78.200 cm/s  PV Mean grad:   2.0 mmHg AV VTI:            0.200 m      RVOT  Peak grad: 2 mmHg AV Peak Grad:      5.7 mmHg AV Mean Grad:      3.0 mmHg LVOT Vmax:         108.00 cm/s LVOT Vmean:        70.000 cm/s LVOT VTI:          0.181 m LVOT/AV VTI ratio: 0.90  AORTA Ao Root diam: 3.70 cm Ao Asc diam:  2.90 cm MITRAL VALVE MV Area (PHT): 4.60 cm    SHUNTS MV Area VTI:   3.84 cm    Systemic VTI:  0.18 m MV Peak grad:  3.9 mmHg    Systemic Diam: 2.20 cm MV Mean grad:  2.0 mmHg    Pulmonic VTI:  0.135 m MV Vmax:       0.98 m/s MV Vmean:      59.9 cm/s MV Decel Time: 165 msec MV E velocity: 53.10 cm/s MV A velocity: 74.90 cm/s MV E/A ratio:  0.71 Dwayne D Callwood MD Electronically signed by Cara JONETTA Lovelace MD Signature Date/Time: 07/21/2024/12:31:46 PM    Final    US  Abdomen Limited RUQ (LIVER/GB) Result Date: 07/20/2024 CLINICAL DATA:  Elevated liver enzymes EXAM: ULTRASOUND ABDOMEN LIMITED RIGHT UPPER QUADRANT COMPARISON:  06/27/2017 FINDINGS: Gallbladder: No gallstones or wall thickening visualized. No sonographic Murphy sign noted by sonographer. Common bile duct: Diameter: 3 mm Liver: Increased liver echotexture most commonly seen with hepatic steatosis. No focal parenchymal liver abnormalities. Portal vein is patent on color Doppler imaging with normal direction of blood flow towards the liver. Other: None. IMPRESSION: 1. Increased liver echotexture most consistent with hepatic steatosis. 2. Otherwise unremarkable exam. Electronically Signed   By: Ozell Daring M.D.   On: 07/20/2024 18:30   DG Chest Portable 1 View Result Date: 07/20/2024 EXAM: 1 VIEW(S) XRAY OF THE CHEST 07/20/2024 02:44:00 PM COMPARISON: 07/18/2018 CLINICAL HISTORY: dyspnea dyspnea FINDINGS: LUNGS AND PLEURA: No focal pulmonary opacity. No pulmonary edema. No pleural effusion. No pneumothorax. HEART AND MEDIASTINUM: No acute abnormality of the cardiac and mediastinal silhouettes. BONES AND SOFT TISSUES: No acute osseous abnormality. IMPRESSION: 1. No acute cardiopulmonary disease. Electronically signed by:  Lynwood Seip MD 07/20/2024 02:47 PM EST RP Workstation: [id number]   CT Head Wo Contrast Result Date: 07/20/2024 EXAM: CT HEAD WITHOUT CONTRAST 07/20/2024 02:17:49 PM TECHNIQUE: CT of the head was performed without the administration of intravenous  contrast. Automated exposure control, iterative reconstruction, and/or weight based adjustment of the mA/kV was utilized to reduce the radiation dose to as low as reasonably achievable. COMPARISON: Head CT 07/01/2013. CLINICAL HISTORY: 58 year old male. Dizzy. FINDINGS: BRAIN AND VENTRICLES: Chronic lacunar infarct left thalamus is unchanged. No acute hemorrhage. No evidence of acute infarct. Gray-white differentiation elsewhere remains normal. Background brain volume remains normal. Faint vascular calcifications bilateral basal ganglia. Calcified atherosclerosis at the skull base. No suspicious intracranial vascular hyperdensity. No hydrocephalus. No extra-axial collection. No mass effect or midline shift. ORBITS: No acute abnormality. SINUSES: Visible paranasal sinuses, middle ears and mastoids remain well aerated. SOFT TISSUES AND SKULL: No acute soft tissue abnormality. No skull fracture. IMPRESSION: 1. No acute intracranial abnormality. 2. Small chronic left thalamic lacunar infarct. Electronically signed by: Helayne Hurst MD 07/20/2024 02:23 PM EST RP Workstation: [id number]      Impression / Plan:   Assessment: Principal Problem:   Acute alcoholic hepatitis (HCC)   [name] is a 58 y.o. y/o male with hepatocellular injury with increased AST and ALT.  This is likely acute on chronic inflammation because of the patient's alcohol abuse on top of possible hypotension causing shock liver.  The differential diagnosis also includes a viral infection.  Plan:  Patient will have his labs followed and will continue to monitor either the progression or regression of the liver enzymes.  The patient will also have blood work sent off for other possible cause  of abnormal liver enzymes.  The patient has been explained the plan and agrees with it.   Thank you for involving me in the care of this patient.      LOS: 1 day   Rogelia Copping, MD, MD. NOLIA 07/22/2024, 1:02 PM,  Pager [phone] 7am-5pm  Check AMION for 5pm -7am coverage and on weekends   Note: This dictation was prepared with Dragon dictation along with smaller phrase technology. Any transcriptional errors that result from this process are unintentional.

## 2024-07-22 NOTE — Progress Notes (Signed)
 Mobility Specialist Progress Note:    07/22/24 0940  Mobility  Activity Dangled on edge of bed  Level of Assistance Independent after set-up  Activity Response Tolerated well  Mobility visit 1 Mobility  Mobility Specialist Start Time (ACUTE ONLY) 0932  Mobility Specialist Stop Time (ACUTE ONLY) 0940  Mobility Specialist Time Calculation (min) (ACUTE ONLY) 8 min   Pt requesting assistance. Became dizzy upon sitting EOB, refused further mobility at this time. Returned pt supine, alarm on and all needs met.  Sherrilee Ditty Mobility Specialist Please contact via Special Educational Needs Teacher or  Rehab office at [phone]

## 2024-07-22 NOTE — Progress Notes (Signed)
 Mobility Specialist Progress Note:    07/22/24 1614  Mobility  Activity Stood at bedside;Pivoted/transferred from bed to chair;Ambulated with assistance  Level of Assistance Standby assist, set-up cues, supervision of patient - no hands on  Assistive Device None  Distance Ambulated (ft) 5 ft  Range of Motion/Exercises Active;All extremities  Activity Response Tolerated well  Mobility visit 1 Mobility  Mobility Specialist Start Time (ACUTE ONLY) 1548  Mobility Specialist Stop Time (ACUTE ONLY) 1600  Mobility Specialist Time Calculation (min) (ACUTE ONLY) 12 min   Pt requesting assistance. Required SBA to stand and ambulate with no AD. Tolerated well, denies dizziness. Alarm on and belongings in reach. All needs met.  Sherrilee Ditty Mobility Specialist Please contact via Special Educational Needs Teacher or  Rehab office at [phone]

## 2024-07-22 NOTE — Plan of Care (Signed)

## 2024-07-23 ENCOUNTER — Inpatient Hospital Stay

## 2024-07-23 DIAGNOSIS — K7010 Alcoholic hepatitis without ascites: Secondary | ICD-10-CM | POA: Diagnosis not present

## 2024-07-23 LAB — CBC WITH DIFFERENTIAL/PLATELET
Abs Immature Granulocytes: 0.01 K/uL (ref 0.00–0.07)
Basophils Absolute: 0 K/uL (ref 0.0–0.1)
Basophils Relative: 1 %
Eosinophils Absolute: 0.3 K/uL (ref 0.0–0.5)
Eosinophils Relative: 8 %
HCT: 37.8 % — ABNORMAL LOW (ref 39.0–52.0)
Hemoglobin: 13.3 g/dL (ref 13.0–17.0)
Immature Granulocytes: 0 %
Lymphocytes Relative: 13 %
Lymphs Abs: 0.5 K/uL — ABNORMAL LOW (ref 0.7–4.0)
MCH: 34.5 pg — ABNORMAL HIGH (ref 26.0–34.0)
MCHC: 35.2 g/dL (ref 30.0–36.0)
MCV: 97.9 fL (ref 80.0–100.0)
Monocytes Absolute: 0.6 K/uL (ref 0.1–1.0)
Monocytes Relative: 16 %
Neutro Abs: 2.3 K/uL (ref 1.7–7.7)
Neutrophils Relative %: 62 %
Platelets: 129 K/uL — ABNORMAL LOW (ref 150–400)
RBC: 3.86 MIL/uL — ABNORMAL LOW (ref 4.22–5.81)
RDW: 14.4 % (ref 11.5–15.5)
WBC: 3.8 K/uL — ABNORMAL LOW (ref 4.0–10.5)
nRBC: 0 % (ref 0.0–0.2)

## 2024-07-23 LAB — COMPREHENSIVE METABOLIC PANEL WITH GFR
ALT: 437 U/L — ABNORMAL HIGH (ref 0–44)
AST: 584 U/L — ABNORMAL HIGH (ref 15–41)
Albumin: 3.2 g/dL — ABNORMAL LOW (ref 3.5–5.0)
Alkaline Phosphatase: 75 U/L (ref 38–126)
Anion gap: 10 (ref 5–15)
BUN: 9 mg/dL (ref 6–20)
CO2: 27 mmol/L (ref 22–32)
Calcium: 8.5 mg/dL — ABNORMAL LOW (ref 8.9–10.3)
Chloride: 97 mmol/L — ABNORMAL LOW (ref 98–111)
Creatinine, Ser: 0.95 mg/dL (ref 0.61–1.24)
GFR, Estimated: >60 mL/min (ref >60)
Glucose, Bld: 105 mg/dL — ABNORMAL HIGH (ref 70–99)
Potassium: 3.9 mmol/L (ref 3.5–5.1)
Sodium: 134 mmol/L — ABNORMAL LOW (ref 135–145)
Total Bilirubin: 0.6 mg/dL (ref 0.0–1.2)
Total Protein: 7.2 g/dL (ref 6.5–8.1)

## 2024-07-23 LAB — RESPIRATORY PANEL BY PCR

## 2024-07-23 LAB — MITOCHONDRIAL ANTIBODIES: Mitochondrial M2 Ab, IgG: <20 U (ref 0.0–20.0)

## 2024-07-23 LAB — ANTI-SMOOTH MUSCLE ANTIBODY, IGG: F-Actin IgG: 5 U (ref 0–19)

## 2024-07-23 LAB — MAGNESIUM: Magnesium: 2.3 mg/dL (ref 1.7–2.4)

## 2024-07-23 LAB — ALPHA-1-ANTITRYPSIN: A-1 Antitrypsin, Ser: 190 mg/dL — ABNORMAL HIGH (ref 101–187)

## 2024-07-23 LAB — CERULOPLASMIN: Ceruloplasmin: 23.2 mg/dL (ref 16.0–31.0)

## 2024-07-23 LAB — EPSTEIN-BARR VIRUS (EBV) ANTIBODY PROFILE
EBV NA IgG: 386 U/mL — ABNORMAL HIGH (ref 0.0–17.9)
EBV VCA IgG: >600 U/mL — ABNORMAL HIGH (ref 0.0–17.9)
EBV VCA IgM: 57.7 U/mL — ABNORMAL HIGH (ref 0.0–35.9)

## 2024-07-23 LAB — PHOSPHORUS: Phosphorus: 4.5 mg/dL (ref 2.5–4.6)

## 2024-07-23 LAB — PROTIME-INR
INR: 0.9 (ref 0.8–1.2)
Prothrombin Time: 12.9 s (ref 11.4–15.2)

## 2024-07-23 LAB — ANA W/REFLEX: Anti Nuclear Antibody (ANA): NEGATIVE

## 2024-07-23 MED ORDER — GADOBUTROL 1 MMOL/ML IV SOLN
9.0000 mL | Freq: Once | INTRAVENOUS | Status: AC | PRN
  Administered 2024-07-23: 9 mL via INTRAVENOUS

## 2024-07-23 MED ORDER — PANTOPRAZOLE SODIUM 40 MG PO TBEC
40.0000 mg | DELAYED_RELEASE_TABLET | Freq: Every day | ORAL | Status: DC
  Administered 2024-07-24: 40 mg via ORAL
  Filled 2024-07-23: qty 1

## 2024-07-23 NOTE — Progress Notes (Signed)
 Physical Therapy Treatment Patient Details Name: [name] MRN: [id number] DOB: 05/31/1966 Today's Date: 07/23/2024   History of Present Illness [name] is a 58 y.o. male with alcohol abuse, prior lacunar infarct, who presents after acute syncopal episode at home.  Patient reports he was sitting outside to enjoy the sun line when he became suddenly diaphoretic and lost consciousness.  He reports he was near family members at that time.  He reports when he came through the family members were very stressed and trying to get him to the hospital.  He recalls being agitated at that time.  No family members present at the time of my evaluation but family members reported to EDP that he was unresponsive for 10 seconds before becoming more alert.  They report he was acutely agitated when he became alert.  With EMS patient had another episode of LOC which lasted approximately 5 seconds.    PT Comments  Pt seen for PT tx with pt agreeable. Pt is able to ambulate in hallway with RW & supervision increasing to CGA as pt reports symptoms increasing with mobility. VSS. Pt performed sit<>stand without BUE support with focus on BLE strengthening. Recommend ongoing PT services to progress mobility without AD.     If plan is discharge home, recommend the following: A little help with walking and/or transfers   Can travel by private vehicle        Equipment Recommendations       Recommendations for Other Services       Precautions / Restrictions Precautions Precautions: None Restrictions Weight Bearing Restrictions Per Provider Order: No     Mobility  Bed Mobility Overal bed mobility: Modified Independent             General bed mobility comments: supine>Sit to exit R side of bed    Transfers   Equipment used: Rolling walker (2 wheels) Transfers: Bed to chair/wheelchair/BSC     Step pivot transfers: Modified independent (Device/Increase time) (without AD)       General  transfer comment: STS from EOB    Ambulation/Gait Ambulation/Gait assistance: Supervision, Contact guard assist Gait Distance (Feet): 100 Feet   Gait Pattern/deviations: Decreased step length - right, Decreased step length - left, Decreased stride length Gait velocity: decreased     General Gait Details: Cuing to decrease BUE lean on RW, supervision increased to CGA as pt with c/o increased symptoms.   Stairs             Wheelchair Mobility     Tilt Bed    Modified Rankin (Stroke Patients Only)       Balance Overall balance assessment: Modified Independent                                          Communication Communication Communication: No apparent difficulties  Cognition Arousal: Alert Behavior During Therapy: WFL for tasks assessed/performed   PT - Cognitive impairments: No apparent impairments                         Following commands: Intact      Cueing Cueing Techniques: Verbal cues  Exercises Other Exercises Other Exercises: Pt performed 10x sit<>Stand from EOB without BUE support with focus on BLE strengthening, endurance training.    General Comments General comments (skin integrity, edema, etc.): BP 113/79 mmHg (91), HR  93 bpm      Pertinent Vitals/Pain Pain Assessment Pain Assessment: No/denies pain    Home Living                          Prior Function            PT Goals (current goals can now be found in the care plan section) Acute Rehab PT Goals Patient Stated Goal: to return home PT Goal Formulation: With patient Time For Goal Achievement: 08/04/24 Potential to Achieve Goals: Good Progress towards PT goals: Progressing toward goals    Frequency    Min 1X/week      PT Plan      Co-evaluation              AM-PAC PT 6 Clicks Mobility   Outcome Measure  Help needed turning from your back to your side while in a flat bed without using bedrails?: None Help needed  moving from lying on your back to sitting on the side of a flat bed without using bedrails?: None Help needed moving to and from a bed to a chair (including a wheelchair)?: None Help needed standing up from a chair using your arms (e.g., wheelchair or bedside chair)?: None Help needed to walk in hospital room?: A Little Help needed climbing 3-5 steps with a railing? : A Little 6 Click Score: 22    End of Session   Activity Tolerance: Patient tolerated treatment well Patient left: in chair;with chair alarm set;with call bell/phone within reach Nurse Communication: Mobility status PT Visit Diagnosis: Dizziness and giddiness (R42);Other abnormalities of gait and mobility (R26.89)     Time: 1420-1430 PT Time Calculation (min) (ACUTE ONLY): 10 min  Charges:    $Therapeutic Activity: 8-22 mins PT General Charges $$ ACUTE PT VISIT: 1 Visit                     [name], PT, DPT 07/23/24, 2:48 PM    [name] 07/23/2024, 2:47 PM

## 2024-07-23 NOTE — Progress Notes (Signed)
 This nurse assisted patient with a shower. Patient became dizzy at the end of the shower.  Patient is dressed and back in the bed. He states it is like the room is spinning, but is feeling better while lying down.  VSS and will ctm. Call bell within reach.

## 2024-07-23 NOTE — Progress Notes (Signed)
 Mobility Specialist Progress Note:    07/23/24 0912  Mobility  Activity Stood at bedside;Pivoted/transferred from bed to chair  Level of Assistance Standby assist, set-up cues, supervision of patient - no hands on  Assistive Device None  Distance Ambulated (ft) 3 ft  Range of Motion/Exercises Active;All extremities  Activity Response Tolerated well  Mobility visit 1 Mobility  Mobility Specialist Start Time (ACUTE ONLY) [id number]  Mobility Specialist Stop Time (ACUTE ONLY) 0912  Mobility Specialist Time Calculation (min) (ACUTE ONLY) 7 min   Pt received in bed, agreeable to mobility. Deferred ambulation d/t breakfast tray coming and pt ready to eat. Required supervision to stand and transfer with no AD. Tolerated well, denies dizziness for short amounts of standing or ambulation. Left in chair, alarm on and belongings in reach. All needs met.  Sherrilee Ditty Mobility Specialist Please contact via Special Educational Needs Teacher or  Rehab office at [phone]

## 2024-07-23 NOTE — Plan of Care (Signed)

## 2024-07-23 NOTE — Progress Notes (Signed)
 PROGRESS NOTE    [name]  FMW:[id number] DOB: 04-13-1966 DOA: 07/20/2024 PCP: Patient, No Pcp Per  Chief Complaint  Patient presents with   Dizziness    Hospital Course:  [name] 58 year old male with alcohol abuse, prior lacunar infarct, who presents after syncopal episode at home.  Patient describes a syncopal episode of sudden diaphoresis followed by loss of consciousness.  His family members report when he came to he was agitated and confused.  While with EMS he had an additional unresponsive episode for 5 seconds.  Initial BP in the ED was 68/48 which quickly resolved.  Labs revealed very elevated AST 712, ALT 177, sodium 134.  UA and UDS WNL.  CXR without acute cardiopulmonary disease.  Head CT with small chronic left thalamic lacunar infarct.  Patient was admitted for workup.  Workup has revealed worsening LFTs and ongoing episodes of diaphoresis with intermittent fever.  Subjective: Reports feeling well but ongoing low back pain   Objective: Vitals:   07/22/24 1701 07/22/24 2024 07/23/24 0432 07/23/24 0854  BP: 118/81 108/74 116/74 122/81  Pulse: 98 87 77 76  Resp: 16 16 18 16   Temp: (!) 100.7 F (38.2 C) 99.3 F (37.4 C) 98.7 F (37.1 C) 98.8 F (37.1 C)  TempSrc: Oral Oral Oral   SpO2: 97% 95% 98% 96%  Weight:      Height:        Intake/Output Summary (Last 24 hours) at 07/23/2024 1340 Last data filed at 07/23/2024 1047 Gross per 24 hour  Intake 200 ml  Output 500 ml  Net -300 ml   Filed Weights   07/20/24 1734  Weight: 90.7 kg    Examination: General exam: Appears calm and comfortable, NAD  Respiratory system: No work of breathing, symmetric chest wall expansion Cardiovascular system: S1 & S2 heard, RRR.  Gastrointestinal system: Abdomen is nondistended, soft and nontender.  Neuro: Alert and oriented. No focal neurological deficits. Extremities: Symmetric, expected ROM MSK: point tenderness spinous process low back Skin: No rashes,  lesions Psychiatry: Demonstrates appropriate judgement and insight. Mood & affect appropriate for situation.   Assessment & Plan:  Principal Problem:   Acute alcoholic hepatitis (HCC)    Syncope - With associated diaphoresis, loss of consciousness, agitation immediately following the event - DDx is broad.  Cannot rule out seizure event, though CK unremarkable. - Brain MRI with chronic lacunar infarct.  No other abnormality. - EEG done today, still pending read, will reach out to neuro - Continue to monitor on telemetry, no acute event so far - Lactic acidosis resolved. - High-sensitivity troponin 2.  No chest pain. - UDS and UA unremarkable.   - Orthostatic vitals WNL. - EKG on arrival NSR - Monitor for arrhythmia. Echo with preserved EF, grade 1 diastolic dysfunction. - Continue fall precautions  Fever and diaphoresis Alcohol hepatitis? - Continues to have intermittent fever and reports generally feeling unwell - Flu/COVID/RSV negative.   - This may also be secondary to alcoholic hepatitis. - UA and CXR unremarkable.  No leukocytosis. - check blood cultures  Back pain Lower thoracic upper lumbar, present one week, with point tenderness, fever - blood cultures - mri  Hematemesis - Had 1 small-volume vomit with self-reported dark blood.  No further episodes.  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  HIV negative. - This may all be acute alcoholic hepatitis.  He did have profound hypotension on arrival so there may be some component of shock liver though LFTs do appear to be improving - Consulted gastroenterology who has broadened workup with ANA, mitochondrial antibodies, anti-smooth muscle antibody, alpha-1 antitrypsin, ceruloplasmin, EBV, CMV, iron studies. -  Continue to trend CMP - Encourage p.o. intake.  On IV fluids.   Does not have a primary care physician - Consult TOC to assist with outpatient resource planning.   Alcohol abuse - Initiate CIWA protocol, not withdrawing currently - Patient endorses two 40 ounce beers per day - EtOH level undetectable on arrival. - Folic acid, multivitamin, thiamine replacement ordered.   Hypotension - BP on arrival 68/48.  Spontaneously resolved.  Already improving.   - Monitor closely.  Does not take medications at home   Hx of CVA - Head CT: chronic lacunar infarct left thalamus no acute infarct. - LDL 87 - Hemoglobin A1c 5.2% - tte unremarkable - will check carotid dopplers  Heart failure with preserved EF - Echocardiogram reveals EF 65 to 70%,  mild concentric LVH, grade 1 diastolic dysfunction.  No significant valvular abnormalities - Clinically appears euvolemic   DVT prophylaxis: SCDs (refuses lovenox)   Code Status: Full Code Disposition:  Inpatient pending clinical resolution  Consultants:  Treatment Team:  Consulting Physician: Jinny Carmine, MD  Procedures:    Antimicrobials:  Anti-infectives (From admission, onward)    None       Data Reviewed: I have personally reviewed following labs and imaging studies CBC: Recent Labs  Lab 07/20/24 1346 07/21/24 0516 07/22/24 0519 07/23/24 0407  WBC 6.4 6.1 5.2 3.8*  NEUTROABS  --  5.4 4.3 2.3  HGB 13.4 11.8* 13.0 13.3  HCT 39.2 33.8* 37.4* 37.8*  MCV 99.5 98.3 98.2 97.9  PLT 179 172 131* 129*   Basic Metabolic Panel: Recent Labs  Lab 07/20/24 1346 07/21/24 0516 07/22/24 0519 07/23/24 0407  NA 134* 133* 131* 134*  K 3.5 3.7 4.0 3.9  CL 98 102 98 97*  CO2 22 23 23 27   GLUCOSE 160* 141* 87 105*  BUN 11 10 8 9   CREATININE 1.05 0.84 0.90 0.95  CALCIUM 8.7* 8.2* 8.8* 8.5*  MG  --  1.8 2.1 2.3  PHOS  --  3.2 3.8 4.5   GFR: Estimated Creatinine Clearance: 94.9 mL/min (by C-G formula based on SCr of 0.95  mg/dL). Liver Function Tests: Recent Labs  Lab 07/20/24 1346 07/21/24 0516 07/22/24 0519 07/23/24 0407  AST 712* 614* 964* 584*  ALT 177* 252* 490* 437*  ALKPHOS 75 70 76 75  BILITOT 1.0 1.3* 1.0 0.6  PROT 7.4 6.0* 6.6 7.2  ALBUMIN 3.5 2.8* 3.3* 3.2*   CBG: Recent Labs  Lab 07/20/24 1358  GLUCAP 120*    Recent Results (from the past 240 hours)  Resp panel by RT-PCR (RSV, Flu A&B, Covid) Anterior Nasal Swab     Status: None   Collection Time: 07/21/24  3:20 PM   Specimen: Anterior Nasal Swab  Result Value Ref Range Status   SARS Coronavirus 2 by RT PCR NEGATIVE NEGATIVE Final    Comment: (NOTE) SARS-CoV-2 target nucleic acids are NOT DETECTED.  The SARS-CoV-2 RNA is generally detectable in upper respiratory specimens during the acute phase of infection. The lowest concentration of SARS-CoV-2 viral copies this assay can detect is 138 copies/mL. A negative result does not preclude SARS-Cov-2 infection and should not be used as the sole  basis for treatment or other patient management decisions. A negative result may occur with  improper specimen collection/handling, submission of specimen other than nasopharyngeal swab, presence of viral mutation(s) within the areas targeted by this assay, and inadequate number of viral copies(<138 copies/mL). A negative result must be combined with clinical observations, patient history, and epidemiological information. The expected result is Negative.  Fact Sheet for Patients:  bloggercourse.com  Fact Sheet for Healthcare Providers:  seriousbroker.it  This test is no t yet approved or cleared by the [place]  FDA and  has been authorized for detection and/or diagnosis of SARS-CoV-2 by FDA under an Emergency Use Authorization (EUA). This EUA will remain  in effect (meaning this test can be used) for the duration of the COVID-19 declaration under Section 564(b)(1) of the Act,  21 U.S.C.section 360bbb-3(b)(1), unless the authorization is terminated  or revoked sooner.       Influenza A by PCR NEGATIVE NEGATIVE Final   Influenza B by PCR NEGATIVE NEGATIVE Final    Comment: (NOTE) The Xpert Xpress SARS-CoV-2/FLU/RSV plus assay is intended as an aid in the diagnosis of influenza from Nasopharyngeal swab specimens and should not be used as a sole basis for treatment. Nasal washings and aspirates are unacceptable for Xpert Xpress SARS-CoV-2/FLU/RSV testing.  Fact Sheet for Patients: bloggercourse.com  Fact Sheet for Healthcare Providers: seriousbroker.it  This test is not yet approved or cleared by the [place]  FDA and has been authorized for detection and/or diagnosis of SARS-CoV-2 by FDA under an Emergency Use Authorization (EUA). This EUA will remain in effect (meaning this test can be used) for the duration of the COVID-19 declaration under Section 564(b)(1) of the Act, 21 U.S.C. section 360bbb-3(b)(1), unless the authorization is terminated or revoked.     Resp Syncytial Virus by PCR NEGATIVE NEGATIVE Final    Comment: (NOTE) Fact Sheet for Patients: bloggercourse.com  Fact Sheet for Healthcare Providers: seriousbroker.it  This test is not yet approved or cleared by the [place]  FDA and has been authorized for detection and/or diagnosis of SARS-CoV-2 by FDA under an Emergency Use Authorization (EUA). This EUA will remain in effect (meaning this test can be used) for the duration of the COVID-19 declaration under Section 564(b)(1) of the Act, 21 U.S.C. section 360bbb-3(b)(1), unless the authorization is terminated or revoked.  Performed at [facility], [address] [place], [place] [address]   Respiratory (~20 pathogens) panel by PCR     Status: None   Collection Time: 07/22/24  7:56 PM   Specimen: Nasopharyngeal Swab;  Respiratory  Result Value Ref Range Status   Adenovirus NOT DETECTED NOT DETECTED Final   Coronavirus 229E NOT DETECTED NOT DETECTED Final    Comment: (NOTE) The Coronavirus on the Respiratory Panel, DOES NOT test for the novel  Coronavirus (2019 nCoV)    Coronavirus HKU1 NOT DETECTED NOT DETECTED Final   Coronavirus NL63 NOT DETECTED NOT DETECTED Final   Coronavirus OC43 NOT DETECTED NOT DETECTED Final   Metapneumovirus NOT DETECTED NOT DETECTED Final   Rhinovirus / Enterovirus NOT DETECTED NOT DETECTED Final   Influenza A NOT DETECTED NOT DETECTED Final   Influenza B NOT DETECTED NOT DETECTED Final   Parainfluenza Virus 1 NOT DETECTED NOT DETECTED Final   Parainfluenza Virus 2 NOT DETECTED NOT DETECTED Final   Parainfluenza Virus 3 NOT DETECTED NOT DETECTED Final   Parainfluenza Virus 4 NOT DETECTED NOT DETECTED Final   Respiratory Syncytial Virus NOT DETECTED NOT DETECTED Final   Bordetella  pertussis NOT DETECTED NOT DETECTED Final   Bordetella Parapertussis NOT DETECTED NOT DETECTED Final   Chlamydophila pneumoniae NOT DETECTED NOT DETECTED Final   Mycoplasma pneumoniae NOT DETECTED NOT DETECTED Final    Comment: Performed at [facility], [address] [place], [place] [address]     Radiology Studies: MR BRAIN W WO CONTRAST Result Date: 07/22/2024 EXAM: MRI BRAIN WITH AND WITHOUT CONTRAST 07/22/2024 03:23:15 PM TECHNIQUE: Multiplanar multisequence MRI of the head/brain was performed with and without the administration of intravenous contrast. COMPARISON: Head CT 07/20/2024. CLINICAL HISTORY: recurrent syncopal episodes, Ct with remote infarct, r/o small lesions or worsening infarcts FINDINGS: BRAIN AND VENTRICLES: There is no evidence of an acute infarct, intracranial hemorrhage, mass, midline shift, hydrocephalus, or extra-axial fluid collection. Cerebral volume is within normal limits for age. A chronic lacunar infarct is again noted in the left thalamus. No significant  white matter disease is seen for age. No abnormal enhancement is identified. Major intracranial vascular flow voids are preserved. ORBITS: No acute abnormality. SINUSES: Mucus retention cyst in the left maxillary sinus. Mild scattered mucosal thickening elsewhere in the paranasal sinuses. Clear mastoid air cells. BONES AND SOFT TISSUES: Normal bone marrow signal and enhancement. No acute soft tissue abnormality. IMPRESSION: 1. No acute intracranial abnormality. 2. Chronic lacunar infarct in the left thalamus. Electronically signed by: Dasie Hamburg MD 07/22/2024 05:03 PM EST RP Workstation: [id number]    Scheduled Meds:  enoxaparin (LOVENOX) injection  40 mg Subcutaneous Q24H   folic acid  1 mg Oral Daily   multivitamin with minerals  1 tablet Oral Daily   pantoprazole (PROTONIX) IV  40 mg Intravenous Q12H   thiamine  100 mg Oral Daily   Or   thiamine  100 mg Intravenous Daily   Continuous Infusions:     LOS: 2 days   Devaughn KATHEE Ban, MD [facility]  To contact the attending physician between 7A-7P please use Epic Chat. To contact the covering physician during after hours 7P-7A, please review Amion.  07/23/2024, 1:40 PM

## 2024-07-23 NOTE — Procedures (Signed)
 Routine EEG Report  [name] is a 58 y.o. male with a history of syncope who is undergoing an EEG to evaluate for seizures.  Report: This EEG was acquired with electrodes placed according to the International 10-20 electrode system (including Fp1, Fp2, F3, F4, C3, C4, P3, P4, O1, O2, T3, T4, T5, T6, A1, A2, Fz, Cz, Pz). The following electrodes were missing or displaced: none.  The occipital dominant rhythm was 10-11 Hz with overriding beta frequencies. This activity is reactive to stimulation. Drowsiness was manifested by background fragmentation; deeper stages of sleep were not identified. There was mild intermittent bifrontal slowing. There were no interictal epileptiform discharges. There were no electrographic seizures identified. There was no abnormal response to photic stimulation or hyperventilation.   Impression and clinical correlation: This EEG was obtained while awake and drowsy and is abnormal due to mild intermittent bifrontal slowing indicative of cerebral dysfunction in those regions. Epileptiform abnormalities were not seen during this recording.  Elida Ross, MD [facility] [phone]  If 7pm- 7am, please page neurology on call as listed in AMION.

## 2024-07-24 ENCOUNTER — Inpatient Hospital Stay

## 2024-07-24 DIAGNOSIS — K7010 Alcoholic hepatitis without ascites: Secondary | ICD-10-CM | POA: Diagnosis not present

## 2024-07-24 DIAGNOSIS — Z8673 Personal history of transient ischemic attack (TIA), and cerebral infarction without residual deficits: Secondary | ICD-10-CM

## 2024-07-24 DIAGNOSIS — F1090 Alcohol use, unspecified, uncomplicated: Secondary | ICD-10-CM | POA: Insufficient documentation

## 2024-07-24 LAB — COMPREHENSIVE METABOLIC PANEL WITH GFR
ALT: 259 U/L — ABNORMAL HIGH (ref 0–44)
AST: 196 U/L — ABNORMAL HIGH (ref 15–41)
Albumin: 3.1 g/dL — ABNORMAL LOW (ref 3.5–5.0)
Alkaline Phosphatase: 82 U/L (ref 38–126)
Anion gap: 8 (ref 5–15)
BUN: 12 mg/dL (ref 6–20)
CO2: 25 mmol/L (ref 22–32)
Calcium: 8.4 mg/dL — ABNORMAL LOW (ref 8.9–10.3)
Chloride: 102 mmol/L (ref 98–111)
Creatinine, Ser: 0.75 mg/dL (ref 0.61–1.24)
GFR, Estimated: >60 mL/min (ref >60)
Glucose, Bld: 100 mg/dL — ABNORMAL HIGH (ref 70–99)
Potassium: 3.7 mmol/L (ref 3.5–5.1)
Sodium: 135 mmol/L (ref 135–145)
Total Bilirubin: 0.5 mg/dL (ref 0.0–1.2)
Total Protein: 6.8 g/dL (ref 6.5–8.1)

## 2024-07-24 LAB — CMV DNA BY PCR, QUALITATIVE: CMV DNA, Qual PCR: NEGATIVE

## 2024-07-24 MED ORDER — ASPIRIN 81 MG PO TBEC
81.0000 mg | DELAYED_RELEASE_TABLET | Freq: Every day | ORAL | 1 refills | Status: AC
Start: 2024-07-24 — End: ?

## 2024-07-24 MED ORDER — OMEPRAZOLE MAGNESIUM 20 MG PO TBEC: 20.0000 mg | DELAYED_RELEASE_TABLET | Freq: Every day | ORAL | 3 refills | Status: AC

## 2024-07-24 NOTE — TOC Initial Note (Signed)
 Transition of Care [facility]) - Initial/Assessment Note    Patient Details  Name: [name] MRN: [id number] Date of Birth: 07/11/1966  Transition of Care [facility]) CM/SW Contact:    Corean ONEIDA Haddock, RN Phone Number: 07/24/2024, 1:27 PM  Clinical Narrative:                    Patient to discharge today Patient states that his family will be transporting him at discharge List of PCPs provided to patient Therapy recommending outpatient therapy Patient declines      Patient Goals and CMS Choice            Expected Discharge Plan and Services         Expected Discharge Date: 07/24/24                                    Prior Living Arrangements/Services                       Activities of Daily Living   ADL Screening (condition at time of admission) Independently performs ADLs?: Yes (appropriate for developmental age) Is the patient deaf or have difficulty hearing?: No Does the patient have difficulty seeing, even when wearing glasses/contacts?: No Does the patient have difficulty concentrating, remembering, or making decisions?: No  Permission Sought/Granted                  Emotional Assessment              Admission diagnosis:  Acute alcoholic hepatitis (HCC) [K70.10] Syncope, unspecified syncope type [R55] Patient Active Problem List   Diagnosis Date Noted   History of CVA (cerebrovascular accident) 07/24/2024   Alcohol use disorder 07/24/2024   Acute alcoholic hepatitis (HCC) 07/20/2024   PCP:  Patient, No Pcp Per Pharmacy:   [facility] [address]), [place] - [address] [place] ([place]) [place] [address] Phone: [phone] Fax: [phone]  [id number] [place], [place] - [address] AT [facility] OF [address] [place] [address] Phone: [phone] Fax: [phone]     Social Drivers of Health (SDOH) Social  History: SDOH Screenings   Food Insecurity: No Food Insecurity (07/20/2024)  Housing: Low Risk  (07/20/2024)  Transportation Needs: No Transportation Needs (07/20/2024)  Utilities: Not At Risk (07/20/2024)  Tobacco Use: Low Risk  (07/20/2024)   SDOH Interventions:     Readmission Risk Interventions     No data to display

## 2024-07-24 NOTE — Discharge Summary (Signed)
 [name] FMW:[id number] DOB: 1966/01/13 DOA: 07/20/2024  PCP: Patient, No Pcp Per  Admit date: 07/20/2024 Discharge date: 07/24/2024  Time spent: 35 minutes  Recommendations for Outpatient Follow-up:  Establish with pcp F/u GI and neurology (referrals placed) Check LFTs at f/u     Discharge Diagnoses:  Principal Problem:   Acute alcoholic hepatitis (HCC) Active Problems:   History of CVA (cerebrovascular accident)   Alcohol use disorder   Discharge Condition: stable  Diet recommendation: heart healthy  Filed Weights   07/20/24 1734  Weight: 90.7 kg    History of present illness:  From admission h and p [name] is a 58 y.o. male with alcohol abuse, prior lacunar infarct, who presents after acute syncopal episode at home.  Patient reports he was sitting outside to enjoy the sun line when he became suddenly diaphoretic and lost consciousness.  He reports he was near family members at that time.  He reports when he came through the family members were very stressed and trying to get him to the hospital.  He recalls being agitated at that time.  No family members present at the time of my evaluation but family members reported to EDP that he was unresponsive for 10 seconds before becoming more alert.  They report he was acutely agitated when he became alert. With EMS patient had another episode of LOC which lasted approximately 5 seconds.   In ED initial BP 68/48 which quickly resolved.  Labs reveal very elevated AST 712, ALT 177, sodium 134.  Labs otherwise unremarkable.  UA and UDS WNL.  CXR without acute cardiopulmonary disease.  Head CT with small chronic left thalamic lacunar infarct.   At the time of my evaluation patient has already returned to his mental status baseline.  He denies any acute complaints.  He does have small laceration over his left eye. Patient reports he had 1 similar episode many years prior and no etiology was ever found.  Hospital Course:    Patient presents after a syncopal episode at home. Workup revealed transaminitis and fever. This appears to be alcohol-induced hepatitis. GI consulted, lab w/u ordered which was negative, LFTs have down-trended and patient feels well, hepatitis is thus resolving and GI advises no additional w/u or treatment here. Abstinence encouraged and substance abuse treatment resources provided. No signs or symptoms of alcohol withdrawal. W/u here for syncope was unrevealing, likely secondary to alcohol abuse. W/u did however reveal evidence prior stroke. TTE and carotid dopplers here negative, no arrhythmia on tele, will discharge on aspirin , holding statin given hepatitis. Patient was given PCP resources and we have referred him to neurology and GI. Patient also reported one episode of possible hematemesis at home, had none here, no evidence other bleeding, and stable normal hemoglobin - we have started a ppi and advise GI f/u as above.   Procedures: none   Consultations: GI  Discharge Exam: Vitals:   07/23/24 2025 07/24/24 0442  BP: 103/72 113/76  Pulse: 86 67  Resp: 20 20  Temp: 98.4 F (36.9 C) 98 F (36.7 C)  SpO2: 95% 99%    General: NAD Cardiovascular: RRR Respiratory: CTAB Abdomen: soft, non-tender, non-distended  Discharge Instructions   Discharge Instructions     Ambulatory referral to Gastroenterology   Complete by: As directed    What is the reason for referral?: Other Comment - alcohol liver disease   Ambulatory referral to Neurology   Complete by: As directed    Diet - low sodium  heart healthy   Complete by: As directed    Increase activity slowly   Complete by: As directed       Allergies as of 07/24/2024       Reactions   Shellfish Protein-containing Drug Products Anaphylaxis        Medication List     STOP taking these medications    famotidine  20 MG tablet Commonly known as: PEPCID    HYDROcodone -acetaminophen  5-325 MG tablet Commonly known as:  NORCO/VICODIN   ibuprofen  600 MG tablet Commonly known as: ADVIL    methocarbamol  500 MG tablet Commonly known as: Robaxin        TAKE these medications    aspirin  EC 81 MG tablet Take 1 tablet (81 mg total) by mouth daily. Take after 12 weeks for prevention of preeclampsia later in pregnancy   omeprazole 20 MG tablet Commonly known as: PriLOSEC OTC Take 1 tablet (20 mg total) by mouth daily.       Allergies  Allergen Reactions   Shellfish Protein-Containing Drug Products Anaphylaxis      The results of significant diagnostics from this hospitalization (including imaging, microbiology, ancillary and laboratory) are listed below for reference.    Significant Diagnostic Studies: US  Carotid Bilateral Result Date: 07/24/2024 CLINICAL DATA:  History of thalamic lacunar infarct.  Syncope. EXAM: BILATERAL CAROTID DUPLEX ULTRASOUND TECHNIQUE: Elnor scale imaging, color Doppler and duplex ultrasound were performed of bilateral carotid and vertebral arteries in the neck. COMPARISON:  None Available. FINDINGS: Criteria: Quantification of carotid stenosis is based on velocity parameters that correlate the residual internal carotid diameter with NASCET-based stenosis levels, using the diameter of the distal internal carotid lumen as the denominator for stenosis measurement. The following velocity measurements were obtained: RIGHT ICA:  66/26 cm/sec CCA:  77/15 cm/sec SYSTOLIC ICA/CCA RATIO:  0.9 ECA:  74 cm/sec LEFT ICA:  79/29 cm/sec CCA:  96/16 cm/sec SYSTOLIC ICA/CCA RATIO:  0.8 ECA:  79 cm/sec RIGHT CAROTID ARTERY: Intimal thickening without focal plaque. No evidence of carotid stenosis in the neck. RIGHT VERTEBRAL ARTERY: Antegrade flow with normal waveform and velocity. LEFT CAROTID ARTERY: Intimal thickening without focal plaque. No evidence of carotid stenosis in the neck. LEFT VERTEBRAL ARTERY: Antegrade flow with normal waveform and velocity. IMPRESSION: Intimal thickening of both carotid  arteries without focal plaque. No evidence of carotid stenosis in the neck bilaterally. Electronically Signed   By: Marcey Moan M.D.   On: 07/24/2024 11:20   MR Lumbar Spine W Wo Contrast Result Date: 07/23/2024 EXAM: MRI Lumbar Spine 07/23/2024 06:53:00 PM TECHNIQUE: Multiplanar multisequence MRI of the lumbar spine was performed with and without the administration of 9mL gadobutrol (GADAVIST) 1 MMOL/ML injection 9 mL GADOBUTROL 1 MMOL/ML IV SOLN. COMPARISON: None available. CLINICAL HISTORY: Fever, low back pain. FINDINGS: BONES AND ALIGNMENT: Normal alignment. Normal vertebral body heights. Bone marrow signal is unremarkable. SPINAL CORD: The conus terminates normally. SOFT TISSUES: No paraspinal mass. L1-L2: No significant disc herniation. No spinal canal stenosis or neural foraminal narrowing. L2-L3: No significant disc herniation. No spinal canal stenosis or neural foraminal narrowing. L3-L4: No significant disc herniation. No spinal canal stenosis or neural foraminal narrowing. L4-L5: Disc height loss and desiccation. Mild broad-based disc bulge with small central annular fissure. No significant canal or foraminal stenosis. L5-S1: No significant disc herniation. No spinal canal stenosis or neural foraminal narrowing. IMPRESSION: 1. No significant canal or foraminal stenosis. 2. L4-L5 degenerative disc disease as detailed above. Electronically signed by: Gilmore Molt MD 07/23/2024 07:11 PM EST RP Workstation:  [id number]   MR THORACIC SPINE W WO CONTRAST Result Date: 07/23/2024 EXAM: MRI Thoracic Spine With and Without Intravenous Contrast 07/23/2024 06:53:00 PM TECHNIQUE: Multiplanar multisequence MRI of the thoracic spine was performed with and without the administration of intravenous contrast. COMPARISON: None available. CLINICAL HISTORY: Fever, low back pain. FINDINGS: BONES AND ALIGNMENT: Normal alignment. Normal vertebral body heights. Bone marrow signal is unremarkable. No abnormal  enhancement. SPINAL CORD: Normal spinal cord volume. Normal spinal cord signal. SOFT TISSUES: Unremarkable. DEGENERATIVE CHANGES: No significant disc herniation. No spinal canal stenosis or neural foraminal narrowing. IMPRESSION: 1. No spinal canal stenosis or neural foraminal narrowing in the thoracic spine. Electronically signed by: Lonni Necessary MD 07/23/2024 07:11 PM EST RP Workstation: [id number]   MR BRAIN W WO CONTRAST Result Date: 07/22/2024 EXAM: MRI BRAIN WITH AND WITHOUT CONTRAST 07/22/2024 03:23:15 PM TECHNIQUE: Multiplanar multisequence MRI of the head/brain was performed with and without the administration of intravenous contrast. COMPARISON: Head CT 07/20/2024. CLINICAL HISTORY: recurrent syncopal episodes, Ct with remote infarct, r/o small lesions or worsening infarcts FINDINGS: BRAIN AND VENTRICLES: There is no evidence of an acute infarct, intracranial hemorrhage, mass, midline shift, hydrocephalus, or extra-axial fluid collection. Cerebral volume is within normal limits for age. A chronic lacunar infarct is again noted in the left thalamus. No significant white matter disease is seen for age. No abnormal enhancement is identified. Major intracranial vascular flow voids are preserved. ORBITS: No acute abnormality. SINUSES: Mucus retention cyst in the left maxillary sinus. Mild scattered mucosal thickening elsewhere in the paranasal sinuses. Clear mastoid air cells. BONES AND SOFT TISSUES: Normal bone marrow signal and enhancement. No acute soft tissue abnormality. IMPRESSION: 1. No acute intracranial abnormality. 2. Chronic lacunar infarct in the left thalamus. Electronically signed by: Dasie Hamburg MD 07/22/2024 05:03 PM EST RP Workstation: [id number]   EEG adult Result Date: 07/21/2024 Matthews Elida HERO, MD     07/23/2024  1:54 PM Routine EEG Report REASON HELZER is a 58 y.o. male with a history of syncope who is undergoing an EEG to evaluate for seizures. Report: This EEG was acquired  with electrodes placed according to the International 10-20 electrode system (including Fp1, Fp2, F3, F4, C3, C4, P3, P4, O1, O2, T3, T4, T5, T6, A1, A2, Fz, Cz, Pz). The following electrodes were missing or displaced: none. The occipital dominant rhythm was 10-11 Hz with overriding beta frequencies. This activity is reactive to stimulation. Drowsiness was manifested by background fragmentation; deeper stages of sleep were not identified. There was mild intermittent bifrontal slowing. There were no interictal epileptiform discharges. There were no electrographic seizures identified. There was no abnormal response to photic stimulation or hyperventilation. Impression and clinical correlation: This EEG was obtained while awake and drowsy and is abnormal due to mild intermittent bifrontal slowing indicative of cerebral dysfunction in those regions. Epileptiform abnormalities were not seen during this recording. Elida Matthews, MD [facility] [phone] If 7pm- 7am, please page neurology on call as listed in AMION.   ECHOCARDIOGRAM COMPLETE Result Date: 07/21/2024    ECHOCARDIOGRAM REPORT   Patient Name:   ALLYN BERTONI Date of Exam: 07/21/2024 Medical Rec #:  [id number]      Height:       70.0 in Accession #:    [id number]     Weight:       200.0 lb Date of Birth:  07-15-1966     BSA:          2.087 m Patient Age:    48  years       BP:           132/75 mmHg Patient Gender: M              HR:           95 bpm. Exam Location:  [facility] Procedure: 2D Echo, Cardiac Doppler and Color Doppler (Both Spectral and Color            Flow Doppler were utilized during procedure). Indications:     Syncope R55  History:         Patient has no prior history of Echocardiogram examinations.                  Signs/Symptoms:Syncope.  Sonographer:     Ashley McNeely-Sloane Referring Phys:  [id number] ALEXANDRA DEZII Diagnosing Phys: Dwayne D Callwood MD IMPRESSIONS  1. Left ventricular ejection fraction, by estimation, is 65 to  70%. The left ventricle has normal function. The left ventricle has no regional wall motion abnormalities. There is mild concentric left ventricular hypertrophy. Left ventricular diastolic parameters are consistent with Grade I diastolic dysfunction (impaired relaxation).  2. Right ventricular systolic function is normal. The right ventricular size is normal. Mildly increased right ventricular wall thickness.  3. The mitral valve is grossly normal. No evidence of mitral valve regurgitation.  4. The aortic valve is normal in structure. Aortic valve regurgitation is not visualized. Conclusion(s)/Recommendation(s): Poor windows for evaluation of left ventricular function by transthoracic echocardiography. Would recommend an alternative means of evaluation. FINDINGS  Left Ventricle: Left ventricular ejection fraction, by estimation, is 65 to 70%. The left ventricle has normal function. The left ventricle has no regional wall motion abnormalities. Strain was performed and the global longitudinal strain is indeterminate. The left ventricular internal cavity size was normal in size. There is mild concentric left ventricular hypertrophy. Left ventricular diastolic parameters are consistent with Grade I diastolic dysfunction (impaired relaxation). Right Ventricle: The right ventricular size is normal. Mildly increased right ventricular wall thickness. Right ventricular systolic function is normal. Left Atrium: Left atrial size was normal in size. Right Atrium: Right atrial size was normal in size. Pericardium: There is no evidence of pericardial effusion. Mitral Valve: The mitral valve is grossly normal. No evidence of mitral valve regurgitation. MV peak gradient, 3.9 mmHg. The mean mitral valve gradient is 2.0 mmHg. Tricuspid Valve: The tricuspid valve is normal in structure. Tricuspid valve regurgitation is not demonstrated. Aortic Valve: The aortic valve is normal in structure. Aortic valve regurgitation is not  visualized. Aortic valve mean gradient measures 3.0 mmHg. Aortic valve peak gradient measures 5.7 mmHg. Aortic valve area, by VTI measures 3.44 cm. Pulmonic Valve: The pulmonic valve was normal in structure. Pulmonic valve regurgitation is not visualized. Aorta: The ascending aorta was not well visualized. IAS/Shunts: No atrial level shunt detected by color flow Doppler. Additional Comments: 3D was performed not requiring image post processing on an independent workstation and was indeterminate.  LEFT VENTRICLE PLAX 2D LVIDd:         4.80 cm   Diastology LVIDs:         2.50 cm   LV e' medial:    9.03 cm/s LV PW:         1.20 cm   LV E/e' medial:  5.9 LV IVS:        1.10 cm   LV e' lateral:   11.50 cm/s LVOT diam:     2.20 cm   LV E/e' lateral: 4.6  LV SV:         69 LV SV Index:   33 LVOT Area:     3.80 cm LV IVRT:       102 msec  RIGHT VENTRICLE RV Basal diam:  3.50 cm RV Mid diam:    2.60 cm RV S prime:     15.00 cm/s TAPSE (M-mode): 1.9 cm LEFT ATRIUM             Index        RIGHT ATRIUM           Index LA diam:        3.50 cm 1.68 cm/m   RA Area:     12.00 cm LA Vol (A2C):   37.8 ml 18.11 ml/m  RA Volume:   21.70 ml  10.40 ml/m LA Vol (A4C):   33.4 ml 16.00 ml/m LA Biplane Vol: 36.8 ml 17.63 ml/m  AORTIC VALVE                    PULMONIC VALVE AV Area (Vmax):    3.45 cm     PV Vmax:        0.94 m/s AV Area (Vmean):   3.40 cm     PV Vmean:       67.900 cm/s AV Area (VTI):     3.44 cm     PV VTI:         0.162 m AV Vmax:           119.00 cm/s  PV Peak grad:   3.5 mmHg AV Vmean:          78.200 cm/s  PV Mean grad:   2.0 mmHg AV VTI:            0.200 m      RVOT Peak grad: 2 mmHg AV Peak Grad:      5.7 mmHg AV Mean Grad:      3.0 mmHg LVOT Vmax:         108.00 cm/s LVOT Vmean:        70.000 cm/s LVOT VTI:          0.181 m LVOT/AV VTI ratio: 0.90  AORTA Ao Root diam: 3.70 cm Ao Asc diam:  2.90 cm MITRAL VALVE MV Area (PHT): 4.60 cm    SHUNTS MV Area VTI:   3.84 cm    Systemic VTI:  0.18 m MV Peak grad:   3.9 mmHg    Systemic Diam: 2.20 cm MV Mean grad:  2.0 mmHg    Pulmonic VTI:  0.135 m MV Vmax:       0.98 m/s MV Vmean:      59.9 cm/s MV Decel Time: 165 msec MV E velocity: 53.10 cm/s MV A velocity: 74.90 cm/s MV E/A ratio:  0.71 Dwayne D Callwood MD Electronically signed by Cara JONETTA Lovelace MD Signature Date/Time: 07/21/2024/12:31:46 PM    Final    US  Abdomen Limited RUQ (LIVER/GB) Result Date: 07/20/2024 CLINICAL DATA:  Elevated liver enzymes EXAM: ULTRASOUND ABDOMEN LIMITED RIGHT UPPER QUADRANT COMPARISON:  06/27/2017 FINDINGS: Gallbladder: No gallstones or wall thickening visualized. No sonographic Murphy sign noted by sonographer. Common bile duct: Diameter: 3 mm Liver: Increased liver echotexture most commonly seen with hepatic steatosis. No focal parenchymal liver abnormalities. Portal vein is patent on color Doppler imaging with normal direction of blood flow towards the liver. Other: None. IMPRESSION: 1. Increased liver echotexture most consistent with hepatic steatosis. 2. Otherwise unremarkable exam.  Electronically Signed   By: Ozell Daring M.D.   On: 07/20/2024 18:30   DG Chest Portable 1 View Result Date: 07/20/2024 EXAM: 1 VIEW(S) XRAY OF THE CHEST 07/20/2024 02:44:00 PM COMPARISON: 07/18/2018 CLINICAL HISTORY: dyspnea dyspnea FINDINGS: LUNGS AND PLEURA: No focal pulmonary opacity. No pulmonary edema. No pleural effusion. No pneumothorax. HEART AND MEDIASTINUM: No acute abnormality of the cardiac and mediastinal silhouettes. BONES AND SOFT TISSUES: No acute osseous abnormality. IMPRESSION: 1. No acute cardiopulmonary disease. Electronically signed by: Lynwood Seip MD 07/20/2024 02:47 PM EST RP Workstation: [id number]   CT Head Wo Contrast Result Date: 07/20/2024 EXAM: CT HEAD WITHOUT CONTRAST 07/20/2024 02:17:49 PM TECHNIQUE: CT of the head was performed without the administration of intravenous contrast. Automated exposure control, iterative reconstruction, and/or weight based adjustment of  the mA/kV was utilized to reduce the radiation dose to as low as reasonably achievable. COMPARISON: Head CT 07/01/2013. CLINICAL HISTORY: 58 year old male. Dizzy. FINDINGS: BRAIN AND VENTRICLES: Chronic lacunar infarct left thalamus is unchanged. No acute hemorrhage. No evidence of acute infarct. Gray-white differentiation elsewhere remains normal. Background brain volume remains normal. Faint vascular calcifications bilateral basal ganglia. Calcified atherosclerosis at the skull base. No suspicious intracranial vascular hyperdensity. No hydrocephalus. No extra-axial collection. No mass effect or midline shift. ORBITS: No acute abnormality. SINUSES: Visible paranasal sinuses, middle ears and mastoids remain well aerated. SOFT TISSUES AND SKULL: No acute soft tissue abnormality. No skull fracture. IMPRESSION: 1. No acute intracranial abnormality. 2. Small chronic left thalamic lacunar infarct. Electronically signed by: Helayne Hurst MD 07/20/2024 02:23 PM EST RP Workstation: [id number]    Microbiology: Recent Results (from the past 240 hours)  Resp panel by RT-PCR (RSV, Flu A&B, Covid) Anterior Nasal Swab     Status: None   Collection Time: 07/21/24  3:20 PM   Specimen: Anterior Nasal Swab  Result Value Ref Range Status   SARS Coronavirus 2 by RT PCR NEGATIVE NEGATIVE Final    Comment: (NOTE) SARS-CoV-2 target nucleic acids are NOT DETECTED.  The SARS-CoV-2 RNA is generally detectable in upper respiratory specimens during the acute phase of infection. The lowest concentration of SARS-CoV-2 viral copies this assay can detect is 138 copies/mL. A negative result does not preclude SARS-Cov-2 infection and should not be used as the sole basis for treatment or other patient management decisions. A negative result may occur with  improper specimen collection/handling, submission of specimen other than nasopharyngeal swab, presence of viral mutation(s) within the areas targeted by this assay, and  inadequate number of viral copies(<138 copies/mL). A negative result must be combined with clinical observations, patient history, and epidemiological information. The expected result is Negative.  Fact Sheet for Patients:  bloggercourse.com  Fact Sheet for Healthcare Providers:  seriousbroker.it  This test is no t yet approved or cleared by the [place]  FDA and  has been authorized for detection and/or diagnosis of SARS-CoV-2 by FDA under an Emergency Use Authorization (EUA). This EUA will remain  in effect (meaning this test can be used) for the duration of the COVID-19 declaration under Section 564(b)(1) of the Act, 21 U.S.C.section 360bbb-3(b)(1), unless the authorization is terminated  or revoked sooner.       Influenza A by PCR NEGATIVE NEGATIVE Final   Influenza B by PCR NEGATIVE NEGATIVE Final    Comment: (NOTE) The Xpert Xpress SARS-CoV-2/FLU/RSV plus assay is intended as an aid in the diagnosis of influenza from Nasopharyngeal swab specimens and should not be used as a sole basis for treatment. Nasal  washings and aspirates are unacceptable for Xpert Xpress SARS-CoV-2/FLU/RSV testing.  Fact Sheet for Patients: bloggercourse.com  Fact Sheet for Healthcare Providers: seriousbroker.it  This test is not yet approved or cleared by the [place]  FDA and has been authorized for detection and/or diagnosis of SARS-CoV-2 by FDA under an Emergency Use Authorization (EUA). This EUA will remain in effect (meaning this test can be used) for the duration of the COVID-19 declaration under Section 564(b)(1) of the Act, 21 U.S.C. section 360bbb-3(b)(1), unless the authorization is terminated or revoked.     Resp Syncytial Virus by PCR NEGATIVE NEGATIVE Final    Comment: (NOTE) Fact Sheet for Patients: bloggercourse.com  Fact Sheet for Healthcare  Providers: seriousbroker.it  This test is not yet approved or cleared by the [place]  FDA and has been authorized for detection and/or diagnosis of SARS-CoV-2 by FDA under an Emergency Use Authorization (EUA). This EUA will remain in effect (meaning this test can be used) for the duration of the COVID-19 declaration under Section 564(b)(1) of the Act, 21 U.S.C. section 360bbb-3(b)(1), unless the authorization is terminated or revoked.  Performed at [facility], [address] [place], [place] [address]   Respiratory (~20 pathogens) panel by PCR     Status: None   Collection Time: 07/22/24  7:56 PM   Specimen: Nasopharyngeal Swab; Respiratory  Result Value Ref Range Status   Adenovirus NOT DETECTED NOT DETECTED Final   Coronavirus 229E NOT DETECTED NOT DETECTED Final    Comment: (NOTE) The Coronavirus on the Respiratory Panel, DOES NOT test for the novel  Coronavirus (2019 nCoV)    Coronavirus HKU1 NOT DETECTED NOT DETECTED Final   Coronavirus NL63 NOT DETECTED NOT DETECTED Final   Coronavirus OC43 NOT DETECTED NOT DETECTED Final   Metapneumovirus NOT DETECTED NOT DETECTED Final   Rhinovirus / Enterovirus NOT DETECTED NOT DETECTED Final   Influenza A NOT DETECTED NOT DETECTED Final   Influenza B NOT DETECTED NOT DETECTED Final   Parainfluenza Virus 1 NOT DETECTED NOT DETECTED Final   Parainfluenza Virus 2 NOT DETECTED NOT DETECTED Final   Parainfluenza Virus 3 NOT DETECTED NOT DETECTED Final   Parainfluenza Virus 4 NOT DETECTED NOT DETECTED Final   Respiratory Syncytial Virus NOT DETECTED NOT DETECTED Final   Bordetella pertussis NOT DETECTED NOT DETECTED Final   Bordetella Parapertussis NOT DETECTED NOT DETECTED Final   Chlamydophila pneumoniae NOT DETECTED NOT DETECTED Final   Mycoplasma pneumoniae NOT DETECTED NOT DETECTED Final    Comment: Performed at [facility], [address] [place], [place] [address]  Culture, blood  (Routine X 2) w Reflex to ID Panel     Status: None (Preliminary result)   Collection Time: 07/23/24  2:08 PM   Specimen: BLOOD  Result Value Ref Range Status   Specimen Description BLOOD BLOOD LEFT ARM  Final   Special Requests   Final    BOTTLES DRAWN AEROBIC AND ANAEROBIC Blood Culture adequate volume   Culture   Final    NO GROWTH < 24 HOURS Performed at [facility], [address] [place], [place] [address]    Report Status PENDING  Incomplete  Culture, blood (Routine X 2) w Reflex to ID Panel     Status: None (Preliminary result)   Collection Time: 07/23/24  2:14 PM   Specimen: BLOOD  Result Value Ref Range Status   Specimen Description BLOOD BLOOD LEFT HAND  Final   Special Requests   Final    BOTTLES  DRAWN AEROBIC AND ANAEROBIC Blood Culture adequate volume   Culture   Final    NO GROWTH < 24 HOURS Performed at [facility], [address] [place], [place] [id number]    Report Status PENDING  Incomplete     Labs: Basic Metabolic Panel: Recent Labs  Lab 07/20/24 1346 07/21/24 0516 07/22/24 0519 07/23/24 0407 07/24/24 0418  NA 134* 133* 131* 134* 135  K 3.5 3.7 4.0 3.9 3.7  CL 98 102 98 97* 102  CO2 22 23 23 27 25   GLUCOSE 160* 141* 87 105* 100*  BUN 11 10 8 9 12   CREATININE 1.05 0.84 0.90 0.95 0.75  CALCIUM 8.7* 8.2* 8.8* 8.5* 8.4*  MG  --  1.8 2.1 2.3  --   PHOS  --  3.2 3.8 4.5  --    Liver Function Tests: Recent Labs  Lab 07/20/24 1346 07/21/24 0516 07/22/24 0519 07/23/24 0407 07/24/24 0418  AST 712* 614* 964* 584* 196*  ALT 177* 252* 490* 437* 259*  ALKPHOS 75 70 76 75 82  BILITOT 1.0 1.3* 1.0 0.6 0.5  PROT 7.4 6.0* 6.6 7.2 6.8  ALBUMIN 3.5 2.8* 3.3* 3.2* 3.1*   Recent Labs  Lab 07/20/24 1346  LIPASE 25   No results for input(s): AMMONIA in the last 168 hours. CBC: Recent Labs  Lab 07/20/24 1346 07/21/24 0516 07/22/24 0519 07/23/24 0407  WBC 6.4 6.1 5.2 3.8*  NEUTROABS  --  5.4 4.3 2.3  HGB 13.4 11.8* 13.0  13.3  HCT 39.2 33.8* 37.4* 37.8*  MCV 99.5 98.3 98.2 97.9  PLT 179 172 131* 129*   Cardiac Enzymes: Recent Labs  Lab 07/20/24 1624  CKTOTAL 174   BNP: BNP (last 3 results) No results for input(s): BNP in the last 8760 hours.  ProBNP (last 3 results) No results for input(s): PROBNP in the last 8760 hours.  CBG: Recent Labs  Lab 07/20/24 1358  GLUCAP 120*       Signed:  Devaughn KATHEE Ban MD.  [facility] 07/24/2024, 12:10 PM

## 2024-07-24 NOTE — Progress Notes (Signed)
 The patient's lab tests are improving and some of his blood work has shown that his EBV IgM and IgG are positive as well as his EBV and a IgG.  This can be seen with past infection.  There is nothing further to do from a GI point of view and the patient to follow-up with GI as an outpatient.  With the improving labs the patient can have his liver enzymes checked again with his primary care provider in another 1 to 2 weeks and if continues to be elevated then would definitely suggest GI follow-up.  I will sign off.  Please call if any further GI concerns or questions.  We would like to thank you for the opportunity to participate in the care of [name].

## 2024-07-28 LAB — CULTURE, BLOOD (ROUTINE X 2)
Culture: NO GROWTH
Culture: NO GROWTH
Special Requests: ADEQUATE
Special Requests: ADEQUATE
